# Patient Record
Sex: Female | Born: 1940
Health system: Southern US, Community
[De-identification: ages and names within clinical notes are randomized; demographics above are authoritative.]

## PROBLEM LIST (undated history)

## (undated) DIAGNOSIS — R519 Headache, unspecified: Secondary | ICD-10-CM

## (undated) DIAGNOSIS — I10 Essential (primary) hypertension: Secondary | ICD-10-CM

## (undated) DIAGNOSIS — M1990 Unspecified osteoarthritis, unspecified site: Secondary | ICD-10-CM

## (undated) DIAGNOSIS — R51 Headache: Secondary | ICD-10-CM

## (undated) HISTORY — PX: TONSILLECTOMY: SUR1361

## (undated) HISTORY — PX: TUBAL LIGATION: SHX77

## (undated) HISTORY — PX: SQUAMOUS CELL CARCINOMA EXCISION: SHX2433

---

## 1999-06-07 ENCOUNTER — Encounter: Admission: RE | Admit: 1999-06-07 | Discharge: 1999-06-07 | Payer: Self-pay | Admitting: Obstetrics and Gynecology

## 1999-06-07 ENCOUNTER — Encounter: Payer: Self-pay | Admitting: Obstetrics and Gynecology

## 2000-06-11 ENCOUNTER — Encounter: Admission: RE | Admit: 2000-06-11 | Discharge: 2000-06-11 | Payer: Self-pay | Admitting: Obstetrics and Gynecology

## 2000-06-11 ENCOUNTER — Encounter: Payer: Self-pay | Admitting: Obstetrics and Gynecology

## 2001-06-16 ENCOUNTER — Encounter: Payer: Self-pay | Admitting: Obstetrics and Gynecology

## 2001-06-16 ENCOUNTER — Encounter: Admission: RE | Admit: 2001-06-16 | Discharge: 2001-06-16 | Payer: Self-pay | Admitting: Obstetrics and Gynecology

## 2002-06-22 ENCOUNTER — Encounter: Payer: Self-pay | Admitting: Obstetrics and Gynecology

## 2002-06-22 ENCOUNTER — Encounter: Admission: RE | Admit: 2002-06-22 | Discharge: 2002-06-22 | Payer: Self-pay | Admitting: Obstetrics and Gynecology

## 2003-06-28 ENCOUNTER — Encounter: Admission: RE | Admit: 2003-06-28 | Discharge: 2003-06-28 | Payer: Self-pay | Admitting: Obstetrics and Gynecology

## 2004-07-05 ENCOUNTER — Ambulatory Visit (HOSPITAL_COMMUNITY): Admission: RE | Admit: 2004-07-05 | Discharge: 2004-07-05 | Payer: Self-pay | Admitting: Obstetrics and Gynecology

## 2010-06-10 ENCOUNTER — Encounter: Payer: Self-pay | Admitting: Obstetrics and Gynecology

## 2011-07-15 DIAGNOSIS — E785 Hyperlipidemia, unspecified: Secondary | ICD-10-CM | POA: Diagnosis not present

## 2011-07-15 DIAGNOSIS — I10 Essential (primary) hypertension: Secondary | ICD-10-CM | POA: Diagnosis not present

## 2011-07-15 DIAGNOSIS — IMO0002 Reserved for concepts with insufficient information to code with codable children: Secondary | ICD-10-CM | POA: Diagnosis not present

## 2011-10-17 DIAGNOSIS — I10 Essential (primary) hypertension: Secondary | ICD-10-CM | POA: Diagnosis not present

## 2011-10-17 DIAGNOSIS — E785 Hyperlipidemia, unspecified: Secondary | ICD-10-CM | POA: Diagnosis not present

## 2011-10-17 DIAGNOSIS — E538 Deficiency of other specified B group vitamins: Secondary | ICD-10-CM | POA: Diagnosis not present

## 2011-10-17 DIAGNOSIS — D649 Anemia, unspecified: Secondary | ICD-10-CM | POA: Diagnosis not present

## 2011-10-17 DIAGNOSIS — Z0000 Encounter for general adult medical examination without abnormal findings: Secondary | ICD-10-CM | POA: Diagnosis not present

## 2011-10-17 DIAGNOSIS — Z124 Encounter for screening for malignant neoplasm of cervix: Secondary | ICD-10-CM | POA: Diagnosis not present

## 2011-10-17 DIAGNOSIS — Z1231 Encounter for screening mammogram for malignant neoplasm of breast: Secondary | ICD-10-CM | POA: Diagnosis not present

## 2011-10-17 DIAGNOSIS — Z681 Body mass index (BMI) 19 or less, adult: Secondary | ICD-10-CM | POA: Diagnosis not present

## 2011-10-21 DIAGNOSIS — Z1231 Encounter for screening mammogram for malignant neoplasm of breast: Secondary | ICD-10-CM | POA: Diagnosis not present

## 2012-03-02 DIAGNOSIS — Z23 Encounter for immunization: Secondary | ICD-10-CM | POA: Diagnosis not present

## 2012-03-13 DIAGNOSIS — E785 Hyperlipidemia, unspecified: Secondary | ICD-10-CM | POA: Diagnosis not present

## 2012-03-13 DIAGNOSIS — M5137 Other intervertebral disc degeneration, lumbosacral region: Secondary | ICD-10-CM | POA: Diagnosis not present

## 2012-03-13 DIAGNOSIS — I10 Essential (primary) hypertension: Secondary | ICD-10-CM | POA: Diagnosis not present

## 2012-03-13 DIAGNOSIS — M899 Disorder of bone, unspecified: Secondary | ICD-10-CM | POA: Diagnosis not present

## 2012-04-02 DIAGNOSIS — E875 Hyperkalemia: Secondary | ICD-10-CM | POA: Diagnosis not present

## 2012-04-02 DIAGNOSIS — M519 Unspecified thoracic, thoracolumbar and lumbosacral intervertebral disc disorder: Secondary | ICD-10-CM | POA: Diagnosis not present

## 2012-04-02 DIAGNOSIS — IMO0002 Reserved for concepts with insufficient information to code with codable children: Secondary | ICD-10-CM | POA: Diagnosis not present

## 2012-04-02 DIAGNOSIS — M5430 Sciatica, unspecified side: Secondary | ICD-10-CM | POA: Diagnosis not present

## 2012-04-27 DIAGNOSIS — M545 Low back pain: Secondary | ICD-10-CM | POA: Diagnosis not present

## 2012-04-27 DIAGNOSIS — IMO0002 Reserved for concepts with insufficient information to code with codable children: Secondary | ICD-10-CM | POA: Diagnosis not present

## 2012-04-27 DIAGNOSIS — R42 Dizziness and giddiness: Secondary | ICD-10-CM | POA: Diagnosis not present

## 2012-04-29 DIAGNOSIS — M545 Low back pain: Secondary | ICD-10-CM | POA: Diagnosis not present

## 2012-05-01 DIAGNOSIS — M545 Low back pain: Secondary | ICD-10-CM | POA: Diagnosis not present

## 2012-05-04 DIAGNOSIS — M545 Low back pain: Secondary | ICD-10-CM | POA: Diagnosis not present

## 2012-05-06 DIAGNOSIS — M545 Low back pain: Secondary | ICD-10-CM | POA: Diagnosis not present

## 2012-05-08 DIAGNOSIS — M545 Low back pain: Secondary | ICD-10-CM | POA: Diagnosis not present

## 2012-05-18 DIAGNOSIS — M545 Low back pain: Secondary | ICD-10-CM | POA: Diagnosis not present

## 2012-05-22 DIAGNOSIS — M545 Low back pain: Secondary | ICD-10-CM | POA: Diagnosis not present

## 2012-05-25 DIAGNOSIS — M545 Low back pain: Secondary | ICD-10-CM | POA: Diagnosis not present

## 2012-05-27 DIAGNOSIS — M545 Low back pain: Secondary | ICD-10-CM | POA: Diagnosis not present

## 2012-05-29 DIAGNOSIS — M545 Low back pain: Secondary | ICD-10-CM | POA: Diagnosis not present

## 2012-06-01 DIAGNOSIS — M545 Low back pain: Secondary | ICD-10-CM | POA: Diagnosis not present

## 2012-06-03 DIAGNOSIS — M545 Low back pain: Secondary | ICD-10-CM | POA: Diagnosis not present

## 2012-06-05 DIAGNOSIS — M545 Low back pain: Secondary | ICD-10-CM | POA: Diagnosis not present

## 2012-06-12 DIAGNOSIS — M545 Low back pain: Secondary | ICD-10-CM | POA: Diagnosis not present

## 2012-07-15 DIAGNOSIS — I10 Essential (primary) hypertension: Secondary | ICD-10-CM | POA: Diagnosis not present

## 2012-07-15 DIAGNOSIS — E785 Hyperlipidemia, unspecified: Secondary | ICD-10-CM | POA: Diagnosis not present

## 2012-08-10 DIAGNOSIS — L570 Actinic keratosis: Secondary | ICD-10-CM | POA: Diagnosis not present

## 2012-08-10 DIAGNOSIS — K130 Diseases of lips: Secondary | ICD-10-CM | POA: Diagnosis not present

## 2012-09-10 DIAGNOSIS — L570 Actinic keratosis: Secondary | ICD-10-CM | POA: Diagnosis not present

## 2012-11-12 DIAGNOSIS — Z1331 Encounter for screening for depression: Secondary | ICD-10-CM | POA: Diagnosis not present

## 2012-11-12 DIAGNOSIS — Z9181 History of falling: Secondary | ICD-10-CM | POA: Diagnosis not present

## 2012-11-12 DIAGNOSIS — E785 Hyperlipidemia, unspecified: Secondary | ICD-10-CM | POA: Diagnosis not present

## 2012-11-12 DIAGNOSIS — R5381 Other malaise: Secondary | ICD-10-CM | POA: Diagnosis not present

## 2012-11-12 DIAGNOSIS — M5430 Sciatica, unspecified side: Secondary | ICD-10-CM | POA: Diagnosis not present

## 2012-11-12 DIAGNOSIS — I10 Essential (primary) hypertension: Secondary | ICD-10-CM | POA: Diagnosis not present

## 2012-11-30 DIAGNOSIS — M899 Disorder of bone, unspecified: Secondary | ICD-10-CM | POA: Diagnosis not present

## 2012-11-30 DIAGNOSIS — Z1231 Encounter for screening mammogram for malignant neoplasm of breast: Secondary | ICD-10-CM | POA: Diagnosis not present

## 2013-02-19 DIAGNOSIS — Z23 Encounter for immunization: Secondary | ICD-10-CM | POA: Diagnosis not present

## 2013-03-17 DIAGNOSIS — IMO0002 Reserved for concepts with insufficient information to code with codable children: Secondary | ICD-10-CM | POA: Diagnosis not present

## 2013-03-17 DIAGNOSIS — E871 Hypo-osmolality and hyponatremia: Secondary | ICD-10-CM | POA: Diagnosis not present

## 2013-03-17 DIAGNOSIS — E785 Hyperlipidemia, unspecified: Secondary | ICD-10-CM | POA: Diagnosis not present

## 2013-03-17 DIAGNOSIS — I10 Essential (primary) hypertension: Secondary | ICD-10-CM | POA: Diagnosis not present

## 2013-03-17 DIAGNOSIS — R233 Spontaneous ecchymoses: Secondary | ICD-10-CM | POA: Diagnosis not present

## 2013-05-25 DIAGNOSIS — IMO0002 Reserved for concepts with insufficient information to code with codable children: Secondary | ICD-10-CM | POA: Diagnosis not present

## 2013-05-25 DIAGNOSIS — R002 Palpitations: Secondary | ICD-10-CM | POA: Diagnosis not present

## 2013-05-27 DIAGNOSIS — E785 Hyperlipidemia, unspecified: Secondary | ICD-10-CM | POA: Diagnosis not present

## 2013-05-27 DIAGNOSIS — I10 Essential (primary) hypertension: Secondary | ICD-10-CM | POA: Diagnosis not present

## 2013-05-27 DIAGNOSIS — R55 Syncope and collapse: Secondary | ICD-10-CM | POA: Diagnosis not present

## 2013-06-07 DIAGNOSIS — R55 Syncope and collapse: Secondary | ICD-10-CM | POA: Diagnosis not present

## 2013-06-11 DIAGNOSIS — Z23 Encounter for immunization: Secondary | ICD-10-CM | POA: Diagnosis not present

## 2013-06-11 DIAGNOSIS — M5430 Sciatica, unspecified side: Secondary | ICD-10-CM | POA: Diagnosis not present

## 2013-06-11 DIAGNOSIS — Z0000 Encounter for general adult medical examination without abnormal findings: Secondary | ICD-10-CM | POA: Diagnosis not present

## 2013-06-11 DIAGNOSIS — IMO0002 Reserved for concepts with insufficient information to code with codable children: Secondary | ICD-10-CM | POA: Diagnosis not present

## 2013-06-21 DIAGNOSIS — R55 Syncope and collapse: Secondary | ICD-10-CM | POA: Diagnosis not present

## 2013-06-23 DIAGNOSIS — R55 Syncope and collapse: Secondary | ICD-10-CM | POA: Diagnosis not present

## 2013-06-24 DIAGNOSIS — R55 Syncope and collapse: Secondary | ICD-10-CM | POA: Diagnosis not present

## 2013-06-24 DIAGNOSIS — I10 Essential (primary) hypertension: Secondary | ICD-10-CM | POA: Diagnosis not present

## 2013-06-24 DIAGNOSIS — E785 Hyperlipidemia, unspecified: Secondary | ICD-10-CM | POA: Diagnosis not present

## 2013-07-09 DIAGNOSIS — R42 Dizziness and giddiness: Secondary | ICD-10-CM | POA: Diagnosis not present

## 2013-07-09 DIAGNOSIS — E785 Hyperlipidemia, unspecified: Secondary | ICD-10-CM | POA: Diagnosis not present

## 2013-07-09 DIAGNOSIS — IMO0002 Reserved for concepts with insufficient information to code with codable children: Secondary | ICD-10-CM | POA: Diagnosis not present

## 2013-07-09 DIAGNOSIS — I10 Essential (primary) hypertension: Secondary | ICD-10-CM | POA: Diagnosis not present

## 2013-07-20 DIAGNOSIS — D0430 Carcinoma in situ of skin of unspecified part of face: Secondary | ICD-10-CM | POA: Diagnosis not present

## 2013-07-20 DIAGNOSIS — D0439 Carcinoma in situ of skin of other parts of face: Secondary | ICD-10-CM | POA: Diagnosis not present

## 2013-07-26 DIAGNOSIS — E871 Hypo-osmolality and hyponatremia: Secondary | ICD-10-CM | POA: Diagnosis not present

## 2013-07-28 DIAGNOSIS — H26499 Other secondary cataract, unspecified eye: Secondary | ICD-10-CM | POA: Diagnosis not present

## 2013-07-28 DIAGNOSIS — Z961 Presence of intraocular lens: Secondary | ICD-10-CM | POA: Diagnosis not present

## 2013-07-28 DIAGNOSIS — H5200 Hypermetropia, unspecified eye: Secondary | ICD-10-CM | POA: Diagnosis not present

## 2013-10-04 DIAGNOSIS — I10 Essential (primary) hypertension: Secondary | ICD-10-CM | POA: Diagnosis not present

## 2013-10-04 DIAGNOSIS — E785 Hyperlipidemia, unspecified: Secondary | ICD-10-CM | POA: Diagnosis not present

## 2013-10-04 DIAGNOSIS — R55 Syncope and collapse: Secondary | ICD-10-CM | POA: Diagnosis not present

## 2013-10-14 DIAGNOSIS — Z961 Presence of intraocular lens: Secondary | ICD-10-CM | POA: Diagnosis not present

## 2013-10-14 DIAGNOSIS — M5430 Sciatica, unspecified side: Secondary | ICD-10-CM | POA: Diagnosis not present

## 2013-10-14 DIAGNOSIS — H26499 Other secondary cataract, unspecified eye: Secondary | ICD-10-CM | POA: Diagnosis not present

## 2013-10-14 DIAGNOSIS — I10 Essential (primary) hypertension: Secondary | ICD-10-CM | POA: Diagnosis not present

## 2013-10-14 DIAGNOSIS — E871 Hypo-osmolality and hyponatremia: Secondary | ICD-10-CM | POA: Diagnosis not present

## 2013-10-14 DIAGNOSIS — E785 Hyperlipidemia, unspecified: Secondary | ICD-10-CM | POA: Diagnosis not present

## 2013-10-25 DIAGNOSIS — M545 Low back pain, unspecified: Secondary | ICD-10-CM | POA: Diagnosis not present

## 2013-10-25 DIAGNOSIS — M533 Sacrococcygeal disorders, not elsewhere classified: Secondary | ICD-10-CM | POA: Diagnosis not present

## 2013-10-29 DIAGNOSIS — M545 Low back pain, unspecified: Secondary | ICD-10-CM | POA: Diagnosis not present

## 2013-10-29 DIAGNOSIS — M533 Sacrococcygeal disorders, not elsewhere classified: Secondary | ICD-10-CM | POA: Diagnosis not present

## 2013-11-01 DIAGNOSIS — M545 Low back pain, unspecified: Secondary | ICD-10-CM | POA: Diagnosis not present

## 2013-11-01 DIAGNOSIS — M533 Sacrococcygeal disorders, not elsewhere classified: Secondary | ICD-10-CM | POA: Diagnosis not present

## 2013-11-05 DIAGNOSIS — M533 Sacrococcygeal disorders, not elsewhere classified: Secondary | ICD-10-CM | POA: Diagnosis not present

## 2013-11-05 DIAGNOSIS — M545 Low back pain, unspecified: Secondary | ICD-10-CM | POA: Diagnosis not present

## 2013-11-08 DIAGNOSIS — M545 Low back pain, unspecified: Secondary | ICD-10-CM | POA: Diagnosis not present

## 2013-11-08 DIAGNOSIS — M533 Sacrococcygeal disorders, not elsewhere classified: Secondary | ICD-10-CM | POA: Diagnosis not present

## 2013-11-12 DIAGNOSIS — M545 Low back pain, unspecified: Secondary | ICD-10-CM | POA: Diagnosis not present

## 2013-11-12 DIAGNOSIS — M533 Sacrococcygeal disorders, not elsewhere classified: Secondary | ICD-10-CM | POA: Diagnosis not present

## 2013-11-15 DIAGNOSIS — M545 Low back pain, unspecified: Secondary | ICD-10-CM | POA: Diagnosis not present

## 2013-11-15 DIAGNOSIS — M533 Sacrococcygeal disorders, not elsewhere classified: Secondary | ICD-10-CM | POA: Diagnosis not present

## 2013-11-17 DIAGNOSIS — M545 Low back pain, unspecified: Secondary | ICD-10-CM | POA: Diagnosis not present

## 2013-11-17 DIAGNOSIS — M533 Sacrococcygeal disorders, not elsewhere classified: Secondary | ICD-10-CM | POA: Diagnosis not present

## 2013-11-22 DIAGNOSIS — M545 Low back pain, unspecified: Secondary | ICD-10-CM | POA: Diagnosis not present

## 2013-11-22 DIAGNOSIS — M533 Sacrococcygeal disorders, not elsewhere classified: Secondary | ICD-10-CM | POA: Diagnosis not present

## 2013-11-26 DIAGNOSIS — M545 Low back pain, unspecified: Secondary | ICD-10-CM | POA: Diagnosis not present

## 2013-11-26 DIAGNOSIS — M533 Sacrococcygeal disorders, not elsewhere classified: Secondary | ICD-10-CM | POA: Diagnosis not present

## 2013-12-01 DIAGNOSIS — M533 Sacrococcygeal disorders, not elsewhere classified: Secondary | ICD-10-CM | POA: Diagnosis not present

## 2013-12-01 DIAGNOSIS — M545 Low back pain, unspecified: Secondary | ICD-10-CM | POA: Diagnosis not present

## 2013-12-02 DIAGNOSIS — Z1231 Encounter for screening mammogram for malignant neoplasm of breast: Secondary | ICD-10-CM | POA: Diagnosis not present

## 2013-12-03 DIAGNOSIS — M545 Low back pain, unspecified: Secondary | ICD-10-CM | POA: Diagnosis not present

## 2013-12-03 DIAGNOSIS — M533 Sacrococcygeal disorders, not elsewhere classified: Secondary | ICD-10-CM | POA: Diagnosis not present

## 2013-12-08 DIAGNOSIS — M545 Low back pain, unspecified: Secondary | ICD-10-CM | POA: Diagnosis not present

## 2013-12-08 DIAGNOSIS — M533 Sacrococcygeal disorders, not elsewhere classified: Secondary | ICD-10-CM | POA: Diagnosis not present

## 2013-12-10 DIAGNOSIS — M533 Sacrococcygeal disorders, not elsewhere classified: Secondary | ICD-10-CM | POA: Diagnosis not present

## 2013-12-10 DIAGNOSIS — M545 Low back pain, unspecified: Secondary | ICD-10-CM | POA: Diagnosis not present

## 2013-12-13 DIAGNOSIS — M545 Low back pain, unspecified: Secondary | ICD-10-CM | POA: Diagnosis not present

## 2013-12-13 DIAGNOSIS — M533 Sacrococcygeal disorders, not elsewhere classified: Secondary | ICD-10-CM | POA: Diagnosis not present

## 2013-12-22 DIAGNOSIS — M545 Low back pain, unspecified: Secondary | ICD-10-CM | POA: Diagnosis not present

## 2013-12-22 DIAGNOSIS — M533 Sacrococcygeal disorders, not elsewhere classified: Secondary | ICD-10-CM | POA: Diagnosis not present

## 2013-12-24 DIAGNOSIS — M545 Low back pain, unspecified: Secondary | ICD-10-CM | POA: Diagnosis not present

## 2013-12-24 DIAGNOSIS — M533 Sacrococcygeal disorders, not elsewhere classified: Secondary | ICD-10-CM | POA: Diagnosis not present

## 2013-12-29 DIAGNOSIS — M533 Sacrococcygeal disorders, not elsewhere classified: Secondary | ICD-10-CM | POA: Diagnosis not present

## 2013-12-29 DIAGNOSIS — M545 Low back pain, unspecified: Secondary | ICD-10-CM | POA: Diagnosis not present

## 2013-12-31 DIAGNOSIS — M533 Sacrococcygeal disorders, not elsewhere classified: Secondary | ICD-10-CM | POA: Diagnosis not present

## 2013-12-31 DIAGNOSIS — M545 Low back pain, unspecified: Secondary | ICD-10-CM | POA: Diagnosis not present

## 2014-01-03 DIAGNOSIS — M545 Low back pain, unspecified: Secondary | ICD-10-CM | POA: Diagnosis not present

## 2014-01-03 DIAGNOSIS — M533 Sacrococcygeal disorders, not elsewhere classified: Secondary | ICD-10-CM | POA: Diagnosis not present

## 2014-01-12 DIAGNOSIS — M545 Low back pain, unspecified: Secondary | ICD-10-CM | POA: Diagnosis not present

## 2014-01-12 DIAGNOSIS — M533 Sacrococcygeal disorders, not elsewhere classified: Secondary | ICD-10-CM | POA: Diagnosis not present

## 2014-01-27 DIAGNOSIS — E785 Hyperlipidemia, unspecified: Secondary | ICD-10-CM | POA: Diagnosis not present

## 2014-01-27 DIAGNOSIS — M25559 Pain in unspecified hip: Secondary | ICD-10-CM | POA: Diagnosis not present

## 2014-01-27 DIAGNOSIS — I10 Essential (primary) hypertension: Secondary | ICD-10-CM | POA: Diagnosis not present

## 2014-01-27 DIAGNOSIS — I471 Supraventricular tachycardia: Secondary | ICD-10-CM | POA: Diagnosis not present

## 2014-02-24 DIAGNOSIS — Z23 Encounter for immunization: Secondary | ICD-10-CM | POA: Diagnosis not present

## 2014-02-25 DIAGNOSIS — M1611 Unilateral primary osteoarthritis, right hip: Secondary | ICD-10-CM | POA: Diagnosis not present

## 2014-03-09 DIAGNOSIS — I10 Essential (primary) hypertension: Secondary | ICD-10-CM | POA: Diagnosis not present

## 2014-03-09 DIAGNOSIS — M519 Unspecified thoracic, thoracolumbar and lumbosacral intervertebral disc disorder: Secondary | ICD-10-CM | POA: Diagnosis not present

## 2014-03-09 DIAGNOSIS — M1611 Unilateral primary osteoarthritis, right hip: Secondary | ICD-10-CM | POA: Diagnosis not present

## 2014-03-09 DIAGNOSIS — K219 Gastro-esophageal reflux disease without esophagitis: Secondary | ICD-10-CM | POA: Diagnosis not present

## 2014-03-09 DIAGNOSIS — I471 Supraventricular tachycardia: Secondary | ICD-10-CM | POA: Diagnosis not present

## 2014-03-09 DIAGNOSIS — E785 Hyperlipidemia, unspecified: Secondary | ICD-10-CM | POA: Diagnosis not present

## 2014-03-09 DIAGNOSIS — Z6822 Body mass index (BMI) 22.0-22.9, adult: Secondary | ICD-10-CM | POA: Diagnosis not present

## 2014-03-16 DIAGNOSIS — M1611 Unilateral primary osteoarthritis, right hip: Secondary | ICD-10-CM | POA: Diagnosis not present

## 2014-03-16 DIAGNOSIS — I10 Essential (primary) hypertension: Secondary | ICD-10-CM | POA: Diagnosis not present

## 2014-03-16 DIAGNOSIS — R55 Syncope and collapse: Secondary | ICD-10-CM | POA: Diagnosis not present

## 2014-03-16 DIAGNOSIS — Z01810 Encounter for preprocedural cardiovascular examination: Secondary | ICD-10-CM | POA: Diagnosis not present

## 2014-03-16 DIAGNOSIS — E785 Hyperlipidemia, unspecified: Secondary | ICD-10-CM | POA: Diagnosis not present

## 2014-03-16 DIAGNOSIS — R002 Palpitations: Secondary | ICD-10-CM | POA: Diagnosis not present

## 2014-05-26 NOTE — Progress Notes (Signed)
Please put orders in Epic surgery 06-08-14 pre op 06-01-14 Thanks

## 2014-05-28 ENCOUNTER — Ambulatory Visit: Payer: Self-pay | Admitting: Orthopedic Surgery

## 2014-05-28 NOTE — Progress Notes (Signed)
Preoperative surgical orders have been place into the Epic hospital system for Veronica Maynard on 05/28/2014, 9:16 AM  by Mickel Crow for surgery on 06/08/2014.  Preop Total Hip - Anterior Approach orders including Experel Injecion, IV Tylenol, and IV Decadron as long as there are no contraindications to the above medications. Veronica Muslim, PA-C

## 2014-05-31 ENCOUNTER — Other Ambulatory Visit (HOSPITAL_COMMUNITY): Payer: Self-pay | Admitting: *Deleted

## 2014-05-31 NOTE — Patient Instructions (Addendum)
Veronica Maynard  05/31/2014   Your procedure is scheduled on: Wednesday 06/08/2014  Report to [facility] Main  Entrance and follow signs to               [place] at [place] AM.  Call this number if you have problems the morning of surgery [phone]   Remember:  Do not eat food or drink liquids :After Midnight.     Take these medicines the morning of surgery with A SIP OF WATER:NONE                               You may not have any metal on your body including hair pins and              piercings  Do not wear jewelry, make-up, lotions, powders or perfumes.             Do not wear nail polish.  Do not shave  48 hours prior to surgery.              Men may shave face and neck.   Do not bring valuables to the hospital. [place].  Contacts, dentures or bridgework may not be worn into surgery.  Leave suitcase in the car. After surgery it may be brought to your room.     Patients discharged the day of surgery will not be allowed to drive home.  Name and phone number of your driver:  Special Instructions: N/A              Please read over the following fact sheets you were given: _____________________________________________________________________             [facility] - Preparing for Surgery Before surgery, you can play an important role.  Because skin is not sterile, your skin needs to be as free of germs as possible.  You can reduce the number of germs on your skin by washing with CHG (chlorahexidine gluconate) soap before surgery.  CHG is an antiseptic cleaner which kills germs and bonds with the skin to continue killing germs even after washing. Please DO NOT use if you have an allergy to CHG or antibacterial soaps.  If your skin becomes reddened/irritated stop using the CHG and inform your nurse when you arrive at Short Stay. Do not shave (including legs and underarms) for at least 48 hours  prior to the first CHG shower.  You may shave your face/neck. Please follow these instructions carefully:  1.  Shower with CHG Soap the night before surgery and the  morning of Surgery.  2.  If you choose to wash your hair, wash your hair first as usual with your  normal  shampoo.  3.  After you shampoo, rinse your hair and body thoroughly to remove the  shampoo.                           4.  Use CHG as you would any other liquid soap.  You can apply chg directly  to the skin and wash                       Gently with  a scrungie or clean washcloth.  5.  Apply the CHG Soap to your body ONLY FROM THE NECK DOWN.   Do not use on face/ open                           Wound or open sores. Avoid contact with eyes, ears mouth and genitals (private parts).                       Wash face,  Genitals (private parts) with your normal soap.             6.  Wash thoroughly, paying special attention to the area where your surgery  will be performed.  7.  Thoroughly rinse your body with warm water from the neck down.  8.  DO NOT shower/wash with your normal soap after using and rinsing off  the CHG Soap.                9.  Pat yourself dry with a clean towel.            10.  Wear clean pajamas.            11.  Place clean sheets on your bed the night of your first shower and do not  sleep with pets. Day of Surgery : Do not apply any lotions/deodorants the morning of surgery.  Please wear clean clothes to the hospital/surgery center.  FAILURE TO FOLLOW THESE INSTRUCTIONS MAY RESULT IN THE CANCELLATION OF YOUR SURGERY PATIENT SIGNATURE_________________________________  NURSE SIGNATURE__________________________________  ________________________________________________________________________   Adam Phenix  An incentive spirometer is a tool that can help keep your lungs clear and active. This tool measures how well you are filling your lungs with each breath. Taking long deep breaths may help reverse  or decrease the chance of developing breathing (pulmonary) problems (especially infection) following:  A long period of time when you are unable to move or be active. BEFORE THE PROCEDURE   If the spirometer includes an indicator to show your best effort, your nurse or respiratory therapist will set it to a desired goal.  If possible, sit up straight or lean slightly forward. Try not to slouch.  Hold the incentive spirometer in an upright position. INSTRUCTIONS FOR USE   Sit on the edge of your bed if possible, or sit up as far as you can in bed or on a chair.  Hold the incentive spirometer in an upright position.  Breathe out normally.  Place the mouthpiece in your mouth and seal your lips tightly around it.  Breathe in slowly and as deeply as possible, raising the piston or the ball toward the top of the column.  Hold your breath for 3-5 seconds or for as long as possible. Allow the piston or ball to fall to the bottom of the column.  Remove the mouthpiece from your mouth and breathe out normally.  Rest for a few seconds and repeat Steps 1 through 7 at least 10 times every 1-2 hours when you are awake. Take your time and take a few normal breaths between deep breaths.  The spirometer may include an indicator to show your best effort. Use the indicator as a goal to work toward during each repetition.  After each set of 10 deep breaths, practice coughing to be sure your lungs are clear. If you have an incision (the cut made at the time of surgery), support your  incision when coughing by placing a pillow or rolled up towels firmly against it. Once you are able to get out of bed, walk around indoors and cough well. You may stop using the incentive spirometer when instructed by your caregiver.  RISKS AND COMPLICATIONS  Take your time so you do not get dizzy or light-headed.  If you are in pain, you may need to take or ask for pain medication before doing incentive spirometry. It is  harder to take a deep breath if you are having pain. AFTER USE  Rest and breathe slowly and easily.  It can be helpful to keep track of a log of your progress. Your caregiver can provide you with a simple table to help with this. If you are using the spirometer at home, follow these instructions: [place] IF:   You are having difficultly using the spirometer.  You have trouble using the spirometer as often as instructed.  Your pain medication is not giving enough relief while using the spirometer.  You develop fever of 100.5 F (38.1 C) or higher. SEEK IMMEDIATE MEDICAL CARE IF:   You cough up bloody sputum that had not been present before.  You develop fever of 102 F (38.9 C) or greater.  You develop worsening pain at or near the incision site. MAKE SURE YOU:   Understand these instructions.  Will watch your condition.  Will get help right away if you are not doing well or get worse. Document Released: 09/16/2006 Document Revised: 07/29/2011 Document Reviewed: 11/17/2006 Exit[employer] Patient Information 2014 Exit[employer], [place].   ________________________________________________________________________  WHAT IS A BLOOD TRANSFUSION? Blood Transfusion Information  A transfusion is the replacement of blood or some of its parts. Blood is made up of multiple cells which provide different functions.  Red blood cells carry oxygen and are used for blood loss replacement.  White blood cells fight against infection.  Platelets control bleeding.  Plasma helps clot blood.  Other blood products are available for specialized needs, such as hemophilia or other clotting disorders. BEFORE THE TRANSFUSION  Who gives blood for transfusions?   Healthy volunteers who are fully evaluated to make sure their blood is safe. This is blood bank blood. Transfusion therapy is the safest it has ever been in the practice of medicine. Before blood is taken from a donor, a complete history  is taken to make sure that person has no history of diseases nor engages in risky social behavior (examples are intravenous drug use or sexual activity with multiple partners). The donor's travel history is screened to minimize risk of transmitting infections, such as malaria. The donated blood is tested for signs of infectious diseases, such as HIV and hepatitis. The blood is then tested to be sure it is compatible with you in order to minimize the chance of a transfusion reaction. If you or a relative donates blood, this is often done in anticipation of surgery and is not appropriate for emergency situations. It takes many days to process the donated blood. RISKS AND COMPLICATIONS Although transfusion therapy is very safe and saves many lives, the main dangers of transfusion include:   Getting an infectious disease.  Developing a transfusion reaction. This is an allergic reaction to something in the blood you were given. Every precaution is taken to prevent this. The decision to have a blood transfusion has been considered carefully by your caregiver before blood is given. Blood is not given unless the benefits outweigh the risks. AFTER THE TRANSFUSION  Right  after receiving a blood transfusion, you will usually feel much better and more energetic. This is especially true if your red blood cells have gotten low (anemic). The transfusion raises the level of the red blood cells which carry oxygen, and this usually causes an energy increase.  The nurse administering the transfusion will monitor you carefully for complications. HOME CARE INSTRUCTIONS  No special instructions are needed after a transfusion. You may find your energy is better. Speak with your caregiver about any limitations on activity for underlying diseases you may have. SEEK MEDICAL CARE IF:   Your condition is not improving after your transfusion.  You develop redness or irritation at the intravenous (IV) site. SEEK IMMEDIATE  MEDICAL CARE IF:  Any of the following symptoms occur over the next 12 hours:  Shaking chills.  You have a temperature by mouth above 102 F (38.9 C), not controlled by medicine.  Chest, back, or muscle pain.  People around you feel you are not acting correctly or are confused.  Shortness of breath or difficulty breathing.  Dizziness and fainting.  You get a rash or develop hives.  You have a decrease in urine output.  Your urine turns a dark color or changes to pink, red, or brown. Any of the following symptoms occur over the next 10 days:  You have a temperature by mouth above 102 F (38.9 C), not controlled by medicine.  Shortness of breath.  Weakness after normal activity.  The white part of the eye turns yellow (jaundice).  You have a decrease in the amount of urine or are urinating less often.  Your urine turns a dark color or changes to pink, red, or brown. Document Released: 05/03/2000 Document Revised: 07/29/2011 Document Reviewed: 12/21/2007 [facility] Patient Information 2014 [place], [place].  _______________________________________________________________________

## 2014-06-01 ENCOUNTER — Ambulatory Visit (HOSPITAL_COMMUNITY)
Admission: RE | Admit: 2014-06-01 | Discharge: 2014-06-01 | Disposition: A | Discharge status: Home / Self Care | Payer: Medicare Other | Source: Ambulatory Visit | Attending: Anesthesiology | Admitting: Anesthesiology

## 2014-06-01 ENCOUNTER — Encounter (HOSPITAL_COMMUNITY): Payer: Self-pay

## 2014-06-01 ENCOUNTER — Encounter (HOSPITAL_COMMUNITY)
Admission: RE | Admit: 2014-06-01 | Discharge: 2014-06-01 | Disposition: A | Discharge status: Home / Self Care | Payer: Medicare Other | Source: Ambulatory Visit | Attending: Orthopedic Surgery | Admitting: Orthopedic Surgery

## 2014-06-01 DIAGNOSIS — Z01812 Encounter for preprocedural laboratory examination: Secondary | ICD-10-CM | POA: Diagnosis not present

## 2014-06-01 DIAGNOSIS — I4891 Unspecified atrial fibrillation: Secondary | ICD-10-CM | POA: Diagnosis not present

## 2014-06-01 DIAGNOSIS — Z01818 Encounter for other preprocedural examination: Secondary | ICD-10-CM | POA: Insufficient documentation

## 2014-06-01 DIAGNOSIS — I2510 Atherosclerotic heart disease of native coronary artery without angina pectoris: Secondary | ICD-10-CM | POA: Insufficient documentation

## 2014-06-01 DIAGNOSIS — Z01810 Encounter for preprocedural cardiovascular examination: Secondary | ICD-10-CM | POA: Diagnosis not present

## 2014-06-01 DIAGNOSIS — I10 Essential (primary) hypertension: Secondary | ICD-10-CM

## 2014-06-01 DIAGNOSIS — Z7982 Long term (current) use of aspirin: Secondary | ICD-10-CM | POA: Diagnosis not present

## 2014-06-01 HISTORY — DX: Headache, unspecified: R51.9

## 2014-06-01 HISTORY — DX: Unspecified osteoarthritis, unspecified site: M19.90

## 2014-06-01 HISTORY — DX: Essential (primary) hypertension: I10

## 2014-06-01 HISTORY — DX: Headache: R51

## 2014-06-01 LAB — CBC
HEMATOCRIT: 37.2 % (ref 36.0–46.0)
Hemoglobin: 12.3 g/dL (ref 12.0–15.0)
MCH: 30 pg (ref 26.0–34.0)
MCHC: 33.1 g/dL (ref 30.0–36.0)
MCV: 90.7 fL (ref 78.0–100.0)
PLATELETS: 309 10*3/uL (ref 150–400)
RBC: 4.1 MIL/uL (ref 3.87–5.11)
RDW: 13.5 % (ref 11.5–15.5)
WBC: 6.4 10*3/uL (ref 4.0–10.5)

## 2014-06-01 LAB — COMPREHENSIVE METABOLIC PANEL
ALBUMIN: 4.4 g/dL (ref 3.5–5.2)
ALT: 19 U/L (ref 0–35)
ANION GAP: 6 (ref 5–15)
AST: 22 U/L (ref 0–37)
Alkaline Phosphatase: 55 U/L (ref 39–117)
BUN: 23 mg/dL (ref 6–23)
CHLORIDE: 93 meq/L — AB (ref 96–112)
CO2: 28 mmol/L (ref 19–32)
CREATININE: 0.92 mg/dL (ref 0.50–1.10)
Calcium: 9.2 mg/dL (ref 8.4–10.5)
GFR calc Af Amer: 69 mL/min — ABNORMAL LOW (ref >90)
GFR calc non Af Amer: 60 mL/min — ABNORMAL LOW (ref >90)
Glucose, Bld: 95 mg/dL (ref 70–99)
POTASSIUM: 5.6 mmol/L — AB (ref 3.5–5.1)
Sodium: 127 mmol/L — ABNORMAL LOW (ref 135–145)
TOTAL PROTEIN: 7.6 g/dL (ref 6.0–8.3)
Total Bilirubin: 0.4 mg/dL (ref 0.3–1.2)

## 2014-06-01 LAB — APTT: aPTT: 31 seconds (ref 24–37)

## 2014-06-01 LAB — URINALYSIS, ROUTINE W REFLEX MICROSCOPIC
Bilirubin Urine: NEGATIVE
Glucose, UA: NEGATIVE mg/dL
Hgb urine dipstick: NEGATIVE
KETONES UR: NEGATIVE mg/dL
Nitrite: NEGATIVE
PH: 6.5 (ref 5.0–8.0)
Protein, ur: NEGATIVE mg/dL
Specific Gravity, Urine: 1.02 (ref 1.005–1.030)
Urobilinogen, UA: 0.2 mg/dL (ref 0.0–1.0)

## 2014-06-01 LAB — URINE MICROSCOPIC-ADD ON

## 2014-06-01 LAB — PROTIME-INR
INR: 0.96 (ref 0.00–1.49)
Prothrombin Time: 12.9 seconds (ref 11.6–15.2)

## 2014-06-01 LAB — ABO/RH: ABO/RH(D): A POS

## 2014-06-01 LAB — SURGICAL PCR SCREEN
MRSA, PCR: NEGATIVE
STAPHYLOCOCCUS AUREUS: NEGATIVE

## 2014-06-01 NOTE — Progress Notes (Signed)
06/07/2013-Transthoracic Echocardiogram by Dr. Agustin Cree on chart. 02/28/2014-pre-operative clearance from Laverna Peace, NP on chart.

## 2014-06-03 NOTE — H&P (Signed)
TOTAL HIP ADMISSION H&P  Patient is admitted for right total hip arthroplasty.  Subjective:  Chief Complaint: right hip pain  HPI: Veronica Maynard, 74 y.o. female, has a history of pain and functional disability in the right hip(s) due to arthritis and patient has failed non-surgical conservative treatments for greater than 12 weeks to include NSAID's and/or analgesics, corticosteriod injections and activity modification.  Onset of symptoms was gradual starting 8 years ago with gradually worsening course since that time.The patient noted no past surgery on the right hip(s).  Patient currently rates pain in the right hip at 8 out of 10 with activity. Patient has night pain, worsening of pain with activity and weight bearing, pain that interfers with activities of daily living, pain with passive range of motion and crepitus. Patient has evidence of subchondral cysts, periarticular osteophytes and joint space narrowing by imaging studies. This condition presents safety issues increasing the risk of falls.  There is no current active infection.   Past Medical History  Diagnosis Date  . Hypertension   . Arthritis   . Headache     due to hormone replacement   Atrial fibrillation- no episodes since Jan 2015    Coronary artery disease  Basal cell carcinoma  Past Surgical History  Procedure Laterality Date  . Tonsillectomy      age 64  . Tubal ligation       Current outpatient prescriptions:  .  acetaminophen (TYLENOL) 500 MG tablet, Take 1,000 mg by mouth every 6 (six) hours as needed for mild pain or moderate pain., Disp: , Rfl:  .  alendronate (FOSAMAX) 70 MG tablet, Take 70 mg by mouth once a week. Take with a full glass of water on an empty stomach., Disp: , Rfl:  .  ascorbic acid (VITAMIN C) 500 MG tablet, Take 500 mg by mouth daily. Sunday-Tuesday-Thursday-Saturday, Disp: , Rfl:  .  aspirin 81 MG tablet, Take 81 mg by mouth daily after breakfast., Disp: , Rfl:  .  calcium  citrate-vitamin D (CITRACAL+D) 315-200 MG-UNIT per tablet, Take 1 tablet by mouth daily., Disp: , Rfl:  .  cholecalciferol (VITAMIN D) 1000 UNITS tablet, Take 1,000 Units by mouth daily. Vitamin D-3, Disp: , Rfl:  .  fluticasone (FLONASE) 50 MCG/ACT nasal spray, Place 2 sprays into both nostrils every evening., Disp: , Rfl:  .  folic acid (FOLVITE) 161 MCG tablet, Take 400 mcg by mouth daily. Monday-Wednesday-Friday, Disp: , Rfl:  .  HYDROcodone-acetaminophen (NORCO/VICODIN) 5-325 MG per tablet, Take 1 tablet by mouth every 6 (six) hours as needed for moderate pain., Disp: , Rfl:  .  loratadine (CLARITIN) 10 MG tablet, Take 10 mg by mouth daily., Disp: , Rfl:  .  metoprolol succinate (TOPROL-XL) 50 MG 24 hr tablet, Take 50 mg by mouth every evening. Take with or immediately following a meal., Disp: , Rfl:  .  olmesartan (BENICAR) 40 MG tablet, Take 40 mg by mouth every morning., Disp: , Rfl:  .  omega-3 fish oil (MAXEPA) 1000 MG CAPS capsule, Take 1 capsule by mouth daily. 1000/300 fish oil/omega 3, Disp: , Rfl:  .  pravastatin (PRAVACHOL) 20 MG tablet, Take 20 mg by mouth daily., Disp: , Rfl:  .  vitamin B-12 (CYANOCOBALAMIN) 1000 MCG tablet, Take 1,000 mcg by mouth daily. Monday-Wednesday-Friday, Disp: , Rfl:  .  Wheat Dextrin (BENEFIBER PO), Take 1 packet by mouth 2 (two) times daily., Disp: , Rfl:   No Known Allergies  History  Substance Use Topics  .  Smoking status: Never Smoker   . Smokeless tobacco: No  . Alcohol Use: Yes     Comment: rarely      Review of Systems  Constitutional: Negative.   HENT: Positive for hearing loss. Negative for congestion, ear discharge, ear pain, nosebleeds, sore throat and tinnitus.   Eyes: Negative.   Respiratory: Positive for shortness of breath. Negative for cough, hemoptysis, sputum production, wheezing and stridor.        SOB on exertion  Cardiovascular: Negative.   Gastrointestinal: Negative.   Genitourinary: Negative.   Musculoskeletal:  Positive for back pain and joint pain. Negative for myalgias, falls and neck pain.  Skin: Negative.   Neurological: Negative.  Negative for headaches.  Endo/Heme/Allergies: Negative.   Psychiatric/Behavioral: Negative.     Objective:  Physical Exam  Constitutional: She is oriented to person, place, and time. She appears well-developed and well-nourished. No distress.  HENT:  Head: Normocephalic and atraumatic.  Right Ear: External ear normal.  Left Ear: External ear normal.  Nose: Nose normal.  Mouth/Throat: Oropharynx is clear and moist.  Eyes: Conjunctivae and EOM are normal.  Neck: Normal range of motion. Neck supple.  Cardiovascular: Normal rate, regular rhythm, normal heart sounds and intact distal pulses.   No murmur heard. Respiratory: Effort normal and breath sounds normal. No respiratory distress. She has no wheezes.  GI: Soft. Bowel sounds are normal. She exhibits no distension. There is no tenderness.  Musculoskeletal:       Right hip: She exhibits decreased range of motion and decreased strength.       Left hip: Normal.       Right knee: Normal.       Left knee: Normal.       Right lower leg: She exhibits no tenderness and no swelling.       Left lower leg: She exhibits no tenderness and no swelling.  Evaluation of the left hip, normal range of motion. No discomfort. Her right hip flexion is 100, minimal internal rotation, about 10 degrees external rotation, 10-20 degrees of abduction. The knee exam is unremarkable.  Neurological: She is alert and oriented to person, place, and time. She has normal strength and normal reflexes. No sensory deficit.  Skin: No rash noted. She is not diaphoretic. No erythema.  Psychiatric: She has a normal mood and affect. Her behavior is normal.    Vitals  Weight: 140 lb Height: 67.25in Body Surface Area: 1.74 m Body Mass Index: 21.76 kg/m  Pulse: 58 (Regular)  BP: 161/65 (Sitting, Left Arm, Standard)  Imaging  Review Plain radiographs demonstrate severe degenerative joint disease of the right hip(s). The bone quality appears to be good for age and reported activity level.  Assessment/Plan:  End stage arthritis, right hip(s)  The patient history, physical examination, clinical judgement of the provider and imaging studies are consistent with end stage degenerative joint disease of the right hip(s) and total hip arthroplasty is deemed medically necessary. The treatment options including medical management, injection therapy, arthroscopy and arthroplasty were discussed at length. The risks and benefits of total hip arthroplasty were presented and reviewed. The risks due to aseptic loosening, infection, stiffness, dislocation/subluxation,  thromboembolic complications and other imponderables were discussed.  The patient acknowledged the explanation, agreed to proceed with the plan and consent was signed. Patient is being admitted for inpatient treatment for surgery, pain control, PT, OT, prophylactic antibiotics, VTE prophylaxis, progressive ambulation and ADL's and discharge planning.The patient is planning to be discharged home with home health  services    Topical TXA PCP: Ilda Basset, Amy Moon Cardio: [address] [place], [place]

## 2014-06-08 ENCOUNTER — Inpatient Hospital Stay (HOSPITAL_COMMUNITY): Payer: Medicare Other | Admitting: Anesthesiology

## 2014-06-08 ENCOUNTER — Encounter (HOSPITAL_COMMUNITY)
Admission: RE | Disposition: A | Discharge status: Home Health | Payer: Self-pay | Source: Ambulatory Visit | Attending: Orthopedic Surgery

## 2014-06-08 ENCOUNTER — Inpatient Hospital Stay (HOSPITAL_COMMUNITY): Payer: Medicare Other

## 2014-06-08 ENCOUNTER — Encounter (HOSPITAL_COMMUNITY): Payer: Self-pay

## 2014-06-08 ENCOUNTER — Inpatient Hospital Stay (HOSPITAL_COMMUNITY)
Admission: RE | Admit: 2014-06-08 | Discharge: 2014-06-12 | DRG: 470 | Disposition: A | Discharge status: Home Health | Payer: Medicare Other | Source: Ambulatory Visit | Attending: Orthopedic Surgery | Admitting: Orthopedic Surgery

## 2014-06-08 DIAGNOSIS — M62838 Other muscle spasm: Secondary | ICD-10-CM | POA: Diagnosis not present

## 2014-06-08 DIAGNOSIS — Z85828 Personal history of other malignant neoplasm of skin: Secondary | ICD-10-CM

## 2014-06-08 DIAGNOSIS — D62 Acute posthemorrhagic anemia: Secondary | ICD-10-CM | POA: Diagnosis not present

## 2014-06-08 DIAGNOSIS — I10 Essential (primary) hypertension: Secondary | ICD-10-CM | POA: Diagnosis present

## 2014-06-08 DIAGNOSIS — Z96641 Presence of right artificial hip joint: Secondary | ICD-10-CM | POA: Diagnosis not present

## 2014-06-08 DIAGNOSIS — M25551 Pain in right hip: Secondary | ICD-10-CM | POA: Diagnosis not present

## 2014-06-08 DIAGNOSIS — M169 Osteoarthritis of hip, unspecified: Secondary | ICD-10-CM | POA: Diagnosis present

## 2014-06-08 DIAGNOSIS — M1611 Unilateral primary osteoarthritis, right hip: Secondary | ICD-10-CM | POA: Diagnosis not present

## 2014-06-08 DIAGNOSIS — Z471 Aftercare following joint replacement surgery: Secondary | ICD-10-CM | POA: Diagnosis not present

## 2014-06-08 DIAGNOSIS — Z96649 Presence of unspecified artificial hip joint: Secondary | ICD-10-CM

## 2014-06-08 HISTORY — DX: Osteoarthritis of hip, unspecified: M16.9

## 2014-06-08 HISTORY — PX: TOTAL HIP ARTHROPLASTY: SHX124

## 2014-06-08 SURGERY — ARTHROPLASTY, HIP, TOTAL, ANTERIOR APPROACH
Anesthesia: Spinal | Site: Hip | Laterality: Right

## 2014-06-08 MED ORDER — BUPIVACAINE HCL (PF) 0.5 % IJ SOLN
INTRAMUSCULAR | Status: AC
  Filled 2014-06-08: qty 30

## 2014-06-08 MED ORDER — ONDANSETRON HCL 4 MG PO TABS: 4.0000 mg | ORAL_TABLET | Freq: Four times a day (QID) | ORAL | Status: DC | PRN

## 2014-06-08 MED ORDER — 0.9 % SODIUM CHLORIDE (POUR BTL) OPTIME
TOPICAL | Status: DC | PRN
  Administered 2014-06-08: 1000 mL

## 2014-06-08 MED ORDER — MORPHINE SULFATE 2 MG/ML IJ SOLN: 1.0000 mg | INTRAMUSCULAR | Status: DC | PRN

## 2014-06-08 MED ORDER — ACETAMINOPHEN 10 MG/ML IV SOLN
1000.0000 mg | Freq: Once | INTRAVENOUS | Status: AC
  Administered 2014-06-08: 1000 mg via INTRAVENOUS
  Filled 2014-06-08: qty 100

## 2014-06-08 MED ORDER — METOCLOPRAMIDE HCL 10 MG PO TABS: 5.0000 mg | ORAL_TABLET | Freq: Three times a day (TID) | ORAL | Status: DC | PRN

## 2014-06-08 MED ORDER — ACETAMINOPHEN 650 MG RE SUPP: 650.0000 mg | Freq: Four times a day (QID) | RECTAL | Status: DC | PRN

## 2014-06-08 MED ORDER — POLYVINYL ALCOHOL 1.4 % OP SOLN
1.0000 [drp] | OPHTHALMIC | Status: DC | PRN
  Administered 2014-06-08: 1 [drp] via OPHTHALMIC
  Filled 2014-06-08: qty 15

## 2014-06-08 MED ORDER — POLYETHYLENE GLYCOL 3350 17 G PO PACK
17.0000 g | PACK | Freq: Every day | ORAL | Status: DC | PRN
  Administered 2014-06-12: 17 g via ORAL
  Filled 2014-06-08: qty 1

## 2014-06-08 MED ORDER — METHOCARBAMOL 1000 MG/10ML IJ SOLN
500.0000 mg | Freq: Four times a day (QID) | INTRAVENOUS | Status: DC | PRN
  Filled 2014-06-08: qty 5

## 2014-06-08 MED ORDER — BUPIVACAINE HCL (PF) 0.25 % IJ SOLN
INTRAMUSCULAR | Status: DC | PRN
  Administered 2014-06-08: 30 mL

## 2014-06-08 MED ORDER — PHENYLEPHRINE HCL 10 MG/ML IJ SOLN
INTRAMUSCULAR | Status: AC
  Filled 2014-06-08: qty 1

## 2014-06-08 MED ORDER — BUPIVACAINE LIPOSOME 1.3 % IJ SUSP
INTRAMUSCULAR | Status: DC | PRN
  Administered 2014-06-08: 20 mL

## 2014-06-08 MED ORDER — ACETAMINOPHEN 500 MG PO TABS
1000.0000 mg | ORAL_TABLET | Freq: Four times a day (QID) | ORAL | Status: DC
  Administered 2014-06-08 – 2014-06-09 (×3): 1000 mg via ORAL
  Filled 2014-06-08 (×4): qty 2

## 2014-06-08 MED ORDER — SODIUM CHLORIDE 0.9 % IJ SOLN
INTRAMUSCULAR | Status: AC
  Filled 2014-06-08: qty 50

## 2014-06-08 MED ORDER — KETOROLAC TROMETHAMINE 15 MG/ML IJ SOLN: 7.5000 mg | Freq: Four times a day (QID) | INTRAMUSCULAR | Status: AC | PRN

## 2014-06-08 MED ORDER — DEXAMETHASONE SODIUM PHOSPHATE 10 MG/ML IJ SOLN
INTRAMUSCULAR | Status: AC
  Filled 2014-06-08: qty 1

## 2014-06-08 MED ORDER — HYDROMORPHONE HCL 1 MG/ML IJ SOLN: 0.2500 mg | INTRAMUSCULAR | Status: DC | PRN

## 2014-06-08 MED ORDER — MIDAZOLAM HCL 5 MG/5ML IJ SOLN
INTRAMUSCULAR | Status: DC | PRN
  Administered 2014-06-08: 2 mg via INTRAVENOUS

## 2014-06-08 MED ORDER — SODIUM CHLORIDE 0.9 % IV SOLN
INTRAVENOUS | Status: DC
  Administered 2014-06-08 – 2014-06-09 (×2): via INTRAVENOUS

## 2014-06-08 MED ORDER — MENTHOL 3 MG MT LOZG: 1.0000 | LOZENGE | OROMUCOSAL | Status: DC | PRN

## 2014-06-08 MED ORDER — STERILE WATER FOR IRRIGATION IR SOLN
Status: DC | PRN
  Administered 2014-06-08: 1500 mL

## 2014-06-08 MED ORDER — SODIUM CHLORIDE 0.9 % IV SOLN
INTRAVENOUS | Status: DC
Start: 2014-06-08 — End: 2014-06-08

## 2014-06-08 MED ORDER — EPHEDRINE SULFATE 50 MG/ML IJ SOLN
INTRAMUSCULAR | Status: DC | PRN
  Administered 2014-06-08 (×2): 5 mg via INTRAVENOUS

## 2014-06-08 MED ORDER — CEFAZOLIN SODIUM-DEXTROSE 2-3 GM-% IV SOLR
INTRAVENOUS | Status: AC
  Filled 2014-06-08: qty 50

## 2014-06-08 MED ORDER — SODIUM CHLORIDE 0.9 % IV SOLN
2000.0000 mg | Freq: Once | INTRAVENOUS | Status: DC
  Filled 2014-06-08: qty 20

## 2014-06-08 MED ORDER — ONDANSETRON HCL 4 MG/2ML IJ SOLN: 4.0000 mg | Freq: Four times a day (QID) | INTRAMUSCULAR | Status: DC | PRN

## 2014-06-08 MED ORDER — DEXAMETHASONE SODIUM PHOSPHATE 10 MG/ML IJ SOLN
10.0000 mg | Freq: Once | INTRAMUSCULAR | Status: AC
  Administered 2014-06-08: 10 mg via INTRAVENOUS

## 2014-06-08 MED ORDER — PROPOFOL 10 MG/ML IV BOLUS
INTRAVENOUS | Status: AC
  Filled 2014-06-08: qty 20

## 2014-06-08 MED ORDER — LORATADINE 10 MG PO TABS
10.0000 mg | ORAL_TABLET | Freq: Every day | ORAL | Status: DC
  Administered 2014-06-08 – 2014-06-12 (×5): 10 mg via ORAL
  Filled 2014-06-08 (×5): qty 1

## 2014-06-08 MED ORDER — RIVAROXABAN 10 MG PO TABS
10.0000 mg | ORAL_TABLET | Freq: Every day | ORAL | Status: DC
  Administered 2014-06-09 – 2014-06-12 (×4): 10 mg via ORAL
  Filled 2014-06-08 (×5): qty 1

## 2014-06-08 MED ORDER — LACTATED RINGERS IV SOLN
INTRAVENOUS | Status: DC
  Administered 2014-06-08 (×2): via INTRAVENOUS
  Administered 2014-06-08: 1000 mL via INTRAVENOUS
  Administered 2014-06-08: 13:00:00 via INTRAVENOUS

## 2014-06-08 MED ORDER — ACETAMINOPHEN 325 MG PO TABS
650.0000 mg | ORAL_TABLET | Freq: Four times a day (QID) | ORAL | Status: DC | PRN
  Administered 2014-06-09 – 2014-06-12 (×8): 650 mg via ORAL
  Filled 2014-06-08 (×8): qty 2

## 2014-06-08 MED ORDER — ONDANSETRON HCL 4 MG/2ML IJ SOLN
INTRAMUSCULAR | Status: AC
  Filled 2014-06-08: qty 2

## 2014-06-08 MED ORDER — BUPIVACAINE HCL (PF) 0.25 % IJ SOLN
INTRAMUSCULAR | Status: AC
  Filled 2014-06-08: qty 30

## 2014-06-08 MED ORDER — BUPIVACAINE HCL (PF) 0.5 % IJ SOLN
INTRAMUSCULAR | Status: DC | PRN
  Administered 2014-06-08: 2.5 mL

## 2014-06-08 MED ORDER — ONDANSETRON HCL 4 MG/2ML IJ SOLN
INTRAMUSCULAR | Status: DC | PRN
  Administered 2014-06-08: 4 mg via INTRAVENOUS

## 2014-06-08 MED ORDER — SODIUM CHLORIDE 0.9 % IJ SOLN
INTRAMUSCULAR | Status: DC | PRN
  Administered 2014-06-08: 40 mL via INTRAVENOUS

## 2014-06-08 MED ORDER — LIDOCAINE HCL (CARDIAC) 20 MG/ML IV SOLN
INTRAVENOUS | Status: AC
  Filled 2014-06-08: qty 5

## 2014-06-08 MED ORDER — METOPROLOL SUCCINATE ER 50 MG PO TB24
50.0000 mg | ORAL_TABLET | Freq: Every evening | ORAL | Status: DC
  Administered 2014-06-08 – 2014-06-11 (×4): 50 mg via ORAL
  Filled 2014-06-08 (×5): qty 1

## 2014-06-08 MED ORDER — FLUTICASONE PROPIONATE 50 MCG/ACT NA SUSP
2.0000 | Freq: Every evening | NASAL | Status: DC
  Administered 2014-06-09: 2 via NASAL
  Filled 2014-06-08: qty 16

## 2014-06-08 MED ORDER — DEXAMETHASONE SODIUM PHOSPHATE 10 MG/ML IJ SOLN
10.0000 mg | Freq: Once | INTRAMUSCULAR | Status: AC
  Administered 2014-06-09: 10 mg via INTRAVENOUS
  Filled 2014-06-08: qty 1

## 2014-06-08 MED ORDER — DOCUSATE SODIUM 100 MG PO CAPS
100.0000 mg | ORAL_CAPSULE | Freq: Two times a day (BID) | ORAL | Status: DC
  Administered 2014-06-08 – 2014-06-12 (×8): 100 mg via ORAL

## 2014-06-08 MED ORDER — PHENOL 1.4 % MT LIQD: 1.0000 | OROMUCOSAL | Status: DC | PRN

## 2014-06-08 MED ORDER — PROPOFOL INFUSION 10 MG/ML OPTIME
INTRAVENOUS | Status: DC | PRN
  Administered 2014-06-08: 100 ug/kg/min via INTRAVENOUS

## 2014-06-08 MED ORDER — FLEET ENEMA 7-19 GM/118ML RE ENEM: 1.0000 | ENEMA | Freq: Once | RECTAL | Status: AC | PRN

## 2014-06-08 MED ORDER — METOCLOPRAMIDE HCL 5 MG/ML IJ SOLN: 5.0000 mg | Freq: Three times a day (TID) | INTRAMUSCULAR | Status: DC | PRN

## 2014-06-08 MED ORDER — FENTANYL CITRATE 0.05 MG/ML IJ SOLN
INTRAMUSCULAR | Status: DC | PRN
  Administered 2014-06-08: 100 ug via INTRAVENOUS

## 2014-06-08 MED ORDER — FENTANYL CITRATE 0.05 MG/ML IJ SOLN
INTRAMUSCULAR | Status: AC
  Filled 2014-06-08: qty 2

## 2014-06-08 MED ORDER — PHENYLEPHRINE HCL 10 MG/ML IJ SOLN
10.0000 mg | INTRAVENOUS | Status: DC | PRN
  Administered 2014-06-08: 10 ug/min via INTRAVENOUS

## 2014-06-08 MED ORDER — CEFAZOLIN SODIUM-DEXTROSE 2-3 GM-% IV SOLR
2.0000 g | INTRAVENOUS | Status: AC
  Administered 2014-06-08: 2 g via INTRAVENOUS

## 2014-06-08 MED ORDER — BUPIVACAINE LIPOSOME 1.3 % IJ SUSP
20.0000 mL | Freq: Once | INTRAMUSCULAR | Status: DC
  Filled 2014-06-08: qty 20

## 2014-06-08 MED ORDER — CEFAZOLIN SODIUM-DEXTROSE 2-3 GM-% IV SOLR
2.0000 g | Freq: Four times a day (QID) | INTRAVENOUS | Status: AC
  Administered 2014-06-08 (×2): 2 g via INTRAVENOUS
  Filled 2014-06-08 (×2): qty 50

## 2014-06-08 MED ORDER — BISACODYL 10 MG RE SUPP: 10.0000 mg | Freq: Every day | RECTAL | Status: DC | PRN

## 2014-06-08 MED ORDER — DIPHENHYDRAMINE HCL 12.5 MG/5ML PO ELIX: 12.5000 mg | ORAL_SOLUTION | ORAL | Status: DC | PRN

## 2014-06-08 MED ORDER — OXYCODONE HCL 5 MG PO TABS
5.0000 mg | ORAL_TABLET | ORAL | Status: DC | PRN
  Administered 2014-06-08 – 2014-06-09 (×3): 5 mg via ORAL
  Filled 2014-06-08 (×3): qty 1

## 2014-06-08 MED ORDER — CHLORHEXIDINE GLUCONATE 4 % EX LIQD: 60.0000 mL | Freq: Once | CUTANEOUS | Status: DC

## 2014-06-08 MED ORDER — METHOCARBAMOL 500 MG PO TABS
500.0000 mg | ORAL_TABLET | Freq: Four times a day (QID) | ORAL | Status: DC | PRN
Start: 2014-06-08 — End: 2014-06-12
  Administered 2014-06-08 – 2014-06-12 (×10): 500 mg via ORAL
  Filled 2014-06-08 (×10): qty 1

## 2014-06-08 MED ORDER — TRAMADOL HCL 50 MG PO TABS
50.0000 mg | ORAL_TABLET | Freq: Four times a day (QID) | ORAL | Status: DC | PRN
  Administered 2014-06-11 – 2014-06-12 (×3): 50 mg via ORAL
  Filled 2014-06-08 (×3): qty 1

## 2014-06-08 MED ORDER — MIDAZOLAM HCL 2 MG/2ML IJ SOLN
INTRAMUSCULAR | Status: AC
  Filled 2014-06-08: qty 2

## 2014-06-08 MED ORDER — IRBESARTAN 300 MG PO TABS
300.0000 mg | ORAL_TABLET | Freq: Every day | ORAL | Status: DC
  Administered 2014-06-11 – 2014-06-12 (×2): 300 mg via ORAL
  Filled 2014-06-08 (×4): qty 1

## 2014-06-08 SURGICAL SUPPLY — 45 items
BAG ZIPLOCK 12X15 (MISCELLANEOUS) IMPLANT
BLADE EXTENDED COATED 6.5IN (ELECTRODE) ×3 IMPLANT
BLADE SAG 18X100X1.27 (BLADE) ×3 IMPLANT
CAPT HIP TOTAL 2 ×3 IMPLANT
CLOSURE STERI-STRIP 1/4X4 (GAUZE/BANDAGES/DRESSINGS) ×3 IMPLANT
CLOSURE WOUND 1/2 X4 (GAUZE/BANDAGES/DRESSINGS) ×1
COVER PERINEAL POST (MISCELLANEOUS) ×3 IMPLANT
CUP ACET PINNACLE SECTR 50MM (Hips) IMPLANT
DECANTER SPIKE VIAL GLASS SM (MISCELLANEOUS) ×3 IMPLANT
DRAPE C-ARM 42X120 X-RAY (DRAPES) ×3 IMPLANT
DRAPE STERI IOBAN 125X83 (DRAPES) ×3 IMPLANT
DRAPE U-SHAPE 47X51 STRL (DRAPES) ×9 IMPLANT
DRSG ADAPTIC 3X8 NADH LF (GAUZE/BANDAGES/DRESSINGS) ×3 IMPLANT
DRSG MEPILEX BORDER 4X4 (GAUZE/BANDAGES/DRESSINGS) ×3 IMPLANT
DRSG MEPILEX BORDER 4X8 (GAUZE/BANDAGES/DRESSINGS) ×3 IMPLANT
DURAPREP 26ML APPLICATOR (WOUND CARE) ×3 IMPLANT
ELECT REM PT RETURN 9FT ADLT (ELECTROSURGICAL) ×3
ELECTRODE REM PT RTRN 9FT ADLT (ELECTROSURGICAL) ×1 IMPLANT
EVACUATOR 1/8 PVC DRAIN (DRAIN) ×3 IMPLANT
FACESHIELD WRAPAROUND (MASK) ×12 IMPLANT
GLOVE BIO SURGEON STRL SZ7.5 (GLOVE) ×3 IMPLANT
GLOVE BIO SURGEON STRL SZ8 (GLOVE) ×6 IMPLANT
GLOVE BIOGEL PI IND STRL 8 (GLOVE) ×2 IMPLANT
GLOVE BIOGEL PI INDICATOR 8 (GLOVE) ×4
GOWN STRL REUS W/TWL LRG LVL3 (GOWN DISPOSABLE) ×3 IMPLANT
GOWN STRL REUS W/TWL XL LVL3 (GOWN DISPOSABLE) ×3 IMPLANT
KIT BASIN OR (CUSTOM PROCEDURE TRAY) ×3 IMPLANT
LINER MARATHON 32 50 (Hips) IMPLANT
LINER MARATHON NEUT +4X52X32 (Hips) IMPLANT
NDL SAFETY ECLIPSE 18X1.5 (NEEDLE) ×2 IMPLANT
NEEDLE HYPO 18GX1.5 SHARP (NEEDLE) ×4
PACK TOTAL JOINT (CUSTOM PROCEDURE TRAY) ×3 IMPLANT
PINNACLE SECTOR CUP 50MM (Hips) IMPLANT
SCREW 6.5MMX25MM (Screw) IMPLANT
SCREW 6.5MMX30MM (Screw) IMPLANT
STRIP CLOSURE SKIN 1/2X4 (GAUZE/BANDAGES/DRESSINGS) ×2 IMPLANT
SUT ETHIBOND NAB CT1 #1 30IN (SUTURE) ×3 IMPLANT
SUT MNCRL AB 4-0 PS2 18 (SUTURE) ×3 IMPLANT
SUT VIC AB 2-0 CT1 27 (SUTURE) ×4
SUT VIC AB 2-0 CT1 TAPERPNT 27 (SUTURE) ×2 IMPLANT
SUT VLOC 180 0 24IN GS25 (SUTURE) ×3 IMPLANT
SYR 20CC LL (SYRINGE) ×3 IMPLANT
SYR 50ML LL SCALE MARK (SYRINGE) ×3 IMPLANT
TOWEL OR 17X26 10 PK STRL BLUE (TOWEL DISPOSABLE) ×3 IMPLANT
TRAY FOLEY CATH 14FRSI W/METER (CATHETERS) ×3 IMPLANT

## 2014-06-08 NOTE — Anesthesia Preprocedure Evaluation (Addendum)
Anesthesia Evaluation  Patient identified by MRN, date of birth, ID band Patient awake    Reviewed: Allergy & Precautions, H&P , NPO status , Patient's Chart, lab work & pertinent test results, reviewed documented beta blocker date and time   Airway Mallampati: II  TM Distance: >3 FB Neck ROM: Full    Dental no notable dental hx. (+) Teeth Intact, Dental Advisory Given   Pulmonary neg pulmonary ROS,  breath sounds clear to auscultation  Pulmonary exam normal       Cardiovascular hypertension, Pt. on medications and Pt. on home beta blockers Rhythm:Regular Rate:Normal     Neuro/Psych  Headaches, negative psych ROS   GI/Hepatic negative GI ROS, Neg liver ROS,   Endo/Other  negative endocrine ROS  Renal/GU negative Renal ROS  negative genitourinary   Musculoskeletal   Abdominal   Peds  Hematology negative hematology ROS (+)   Anesthesia Other Findings   Reproductive/Obstetrics negative OB ROS                            Anesthesia Physical Anesthesia Plan  ASA: II  Anesthesia Plan: Spinal   Post-op Pain Management:    Induction: Intravenous  Airway Management Planned: Simple Face Mask  Additional Equipment:   Intra-op Plan:   Post-operative Plan:   Informed Consent: I have reviewed the patients History and Physical, chart, labs and discussed the procedure including the risks, benefits and alternatives for the proposed anesthesia with the patient or authorized representative who has indicated his/her understanding and acceptance.   Dental advisory given  Plan Discussed with: CRNA  Anesthesia Plan Comments:         Anesthesia Quick Evaluation

## 2014-06-08 NOTE — Anesthesia Procedure Notes (Signed)
Spinal Patient location during procedure: OR Start time: 06/08/2014 10:49 AM End time: 06/08/2014 10:54 AM Staffing Resident/CRNA: Lollie Sails Performed by: anesthesiologist and resident/CRNA  Preanesthetic Checklist Completed: patient identified, site marked, surgical consent, pre-op evaluation, timeout performed, IV checked, risks and benefits discussed and monitors and equipment checked Spinal Block Patient position: sitting Prep: Betadine Patient monitoring: heart rate, continuous pulse ox and blood pressure Approach: midline Location: L2-3 Injection technique: single-shot Needle Needle type: Sprotte  Needle gauge: 24 G Needle length: 9 cm Additional Notes Expiration date of kit checked and confirmed. Patient tolerated procedure well, without complications.  Clear CSF return - one attempt.   Lot #[id number]   Exp 2017-07

## 2014-06-08 NOTE — Plan of Care (Signed)
Problem: Consults Goal: Diagnosis- Total Joint Replacement Right anterior hip     

## 2014-06-08 NOTE — Anesthesia Postprocedure Evaluation (Signed)
  Anesthesia Post-op Note  Patient: Veronica Maynard  Procedure(s) Performed: Procedure(s): RIGHT TOTAL HIP ARTHROPLASTY ANTERIOR APPROACH (Right)  Patient Location: PACU  Anesthesia Type: Spinal/MAC  Level of Consciousness: awake and alert   Airway and Oxygen Therapy: Patient Spontanous Breathing  Post-op Pain: none  Post-op Assessment: Post-op Vital signs reviewed, Patient's Cardiovascular Status Stable and Respiratory Function Stable. No residual motor block.  Post-op Vital Signs: Reviewed  Filed Vitals:   06/08/14 1430  BP: 114/48  Pulse: 65  Temp: 36.3 C  Resp: 17    Complications: No apparent anesthesia complications

## 2014-06-08 NOTE — Interval H&P Note (Signed)
History and Physical Interval Note:  06/08/2014 10:00 AM  Veronica Maynard  has presented today for surgery, with the diagnosis of right hip osteoarthritis  The various methods of treatment have been discussed with the patient and family. After consideration of risks, benefits and other options for treatment, the patient has consented to  Procedure(s): RIGHT TOTAL HIP ARTHROPLASTY ANTERIOR APPROACH (Right) as a surgical intervention .  The patient's history has been reviewed, patient examined, no change in status, stable for surgery.  I have reviewed the patient's chart and labs.  Questions were answered to the patient's satisfaction.     Gearlean Alf

## 2014-06-08 NOTE — Transfer of Care (Signed)
Immediate Anesthesia Transfer of Care Note  Patient: Veronica Maynard  Procedure(s) Performed: Procedure(s): RIGHT TOTAL HIP ARTHROPLASTY ANTERIOR APPROACH (Right)  Patient Location: PACU  Anesthesia Type:Spinal  Level of Consciousness: awake, alert  and oriented  Airway & Oxygen Therapy: Patient Spontanous Breathing and Patient connected to face mask oxygen  Post-op Assessment: Report given to PACU RN and Post -op Vital signs reviewed and stable  Post vital signs: Reviewed and stable  Complications: No apparent anesthesia complications

## 2014-06-08 NOTE — Progress Notes (Signed)
Utilization review completed.  

## 2014-06-08 NOTE — Op Note (Signed)
OPERATIVE REPORT  PREOPERATIVE DIAGNOSIS: Osteoarthritis of the Right hip.   POSTOPERATIVE DIAGNOSIS: Osteoarthritis of the Right  hip.   PROCEDURE: Right total hip arthroplasty, anterior approach.   SURGEON: Gaynelle Arabian, MD   ASSISTANT: Arlee Muslim, PA-C  ANESTHESIA:  Spinal  ESTIMATED BLOOD LOSS:-1000 ml   DRAINS: Hemovac x1.   COMPLICATIONS: None   CONDITION: PACU - hemodynamically stable.   BRIEF CLINICAL NOTE: Veronica Maynard is a 74 y.o. female who has advanced end-  stage arthritis of her Right  hip with progressively worsening pain and  dysfunction.The patient has failed nonoperative management and presents for  total hip arthroplasty.   PROCEDURE IN DETAIL: After successful administration of spinal  anesthetic, the traction boots for the [facility] bed were placed on both  feet and the patient was placed onto the [facility] bed, boots placed into the leg  holders. The Right hip was then isolated from the perineum with plastic  drapes and prepped and draped in the usual sterile fashion. ASIS and  greater trochanter were marked and a oblique incision was made, starting  at about 1 cm lateral and 2 cm distal to the ASIS and coursing towards  the anterior cortex of the femur. The skin was cut with a 10 blade  through subcutaneous tissue to the level of the fascia overlying the  tensor fascia lata muscle. The fascia was then incised in line with the  incision at the junction of the anterior third and posterior 2/3rd. The  muscle was teased off the fascia and then the interval between the TFL  and the rectus was developed. The Hohmann retractor was then placed at  the top of the femoral neck over the capsule. The vessels overlying the  capsule were cauterized and the fat on top of the capsule was removed.  A Hohmann retractor was then placed anterior underneath the rectus  femoris to give exposure to the entire anterior capsule. A T-shaped  capsulotomy was performed.  The edges were tagged and the femoral head  was identified.       Osteophytes are removed off the superior acetabulum.  The femoral neck was then cut in situ with an oscillating saw. Traction  was then applied to the left lower extremity utilizing the [facility]  traction. The femoral head was then removed. Retractors were placed  around the acetabulum and then circumferential removal of the labrum was  performed. Osteophytes were also removed. Reaming starts at 45 mm to  medialize and  Increased in 2 mm increments to 49 mm. We reamed in  approximately 40 degrees of abduction, 20 degrees anteversion. A 50 mm  pinnacle acetabular shell was then impacted in anatomic position under  fluoroscopic guidance. I was not satisfied with the stability of the implant and attempted to place 2 screws but they tilted the shell into abduction. A second attempt was made and the same position occurred. I removed the 50 shell and placed a 52 mm Pinnacle acetabular shell with better purchase and placed 2 additional dome screws and the cup remained stable in the desired anatomic position with excellent purchase.  A 32 mm neutral + 4 marathon liner was then  placed into the acetabular shell.       The femoral lift was then placed along the lateral aspect of the femur  just distal to the vastus ridge. The leg was  externally rotated and capsule  was stripped off the inferior aspect  of the femoral neck down to the  level of the lesser trochanter, this was done with electrocautery. The femur was lifted after this was performed. The  leg was then placed and extended in adducted position to essentially delivering the femur. We also removed the capsule superiorly and the  piriformis from the piriformis fossa to gain excellent exposure of the  proximal femur. Rongeur was used to remove some cancellous bone to get  into the lateral portion of the proximal femur for placement of the  initial starter reamer. The starter broaches was  placed  the starter broach  and was shown to go down the center of the canal. Broaching  with the  Corail system was then performed starting at size 8, coursing  Up to size 12. A size 12 had excellent torsional and rotational  and axial stability. The trial standard offset neck was then placed  with a 32 + 1 trial head. The hip was then reduced. We confirmed that  the stem was in the canal both on AP and lateral x-rays. It also has excellent sizing. The hip was reduced with outstanding stability through full extension, full external rotation,  and then flexion in adduction internal rotation. AP pelvis was taken  and the leg lengths were measured and found to be exactly equal. Hip  was then dislocated again and the femoral head and neck removed. The  femoral broach was removed. Size 12 Corail stem with a standard offset  neck was then impacted into the femur following native anteversion. Has  excellent purchase in the canal. Excellent torsional and rotational and  axial stability. It is confirmed to be in the canal on AP and lateral  fluoroscopic views. The 32 + 1 ceramic head was placed and the hip  reduced with outstanding stability. Again AP pelvis was taken and it  confirmed that the leg lengths were equal. The wound was then copiously  irrigated with saline solution and the capsule reattached and repaired  with Ethibond suture.  20 mL of Exparel mixed with 50 mL of saline then additional 20 ml of .25% Bupivicaine injected into the capsule and into the edge of the tensor fascia lata as well as subcutaneous tissue. The fascia overlying the tensor fascia lata was  then closed with a running #1 V-Loc. Subcu was closed with interrupted  2-0 Vicryl and subcuticular running 4-0 Monocryl. Incision was cleaned  and dried. Steri-Strips and a bulky sterile dressing applied. Hemovac  drain was hooked to suction and then he was awakened and transported to  recovery in stable condition.        Please  note that a surgical assistant was a medical necessity for this procedure to perform it in a safe and expeditious manner. Assistant was necessary to provide appropriate retraction of vital neurovascular structures and to prevent femoral fracture and allow for anatomic placement of the prosthesis.  Gaynelle Arabian, M.D.

## 2014-06-08 NOTE — Progress Notes (Signed)
X-ray results noted 

## 2014-06-08 NOTE — Progress Notes (Signed)
Portable AP Pelvis X-RAY done. 

## 2014-06-09 ENCOUNTER — Encounter (HOSPITAL_COMMUNITY): Payer: Self-pay | Admitting: Orthopedic Surgery

## 2014-06-09 LAB — CBC
HEMATOCRIT: 21.4 % — AB (ref 36.0–46.0)
Hemoglobin: 7.4 g/dL — ABNORMAL LOW (ref 12.0–15.0)
MCH: 30.6 pg (ref 26.0–34.0)
MCHC: 34.6 g/dL (ref 30.0–36.0)
MCV: 88.4 fL (ref 78.0–100.0)
Platelets: 181 10*3/uL (ref 150–400)
RBC: 2.42 MIL/uL — ABNORMAL LOW (ref 3.87–5.11)
RDW: 13.3 % (ref 11.5–15.5)
WBC: 7.3 10*3/uL (ref 4.0–10.5)

## 2014-06-09 LAB — BASIC METABOLIC PANEL
ANION GAP: 8 (ref 5–15)
BUN: 21 mg/dL (ref 6–23)
CALCIUM: 8.2 mg/dL — AB (ref 8.4–10.5)
CHLORIDE: 98 meq/L (ref 96–112)
CO2: 24 mmol/L (ref 19–32)
Creatinine, Ser: 0.88 mg/dL (ref 0.50–1.10)
GFR calc Af Amer: 73 mL/min — ABNORMAL LOW (ref >90)
GFR calc non Af Amer: 63 mL/min — ABNORMAL LOW (ref >90)
GLUCOSE: 139 mg/dL — AB (ref 70–99)
Potassium: 4.6 mmol/L (ref 3.5–5.1)
Sodium: 130 mmol/L — ABNORMAL LOW (ref 135–145)

## 2014-06-09 LAB — PREPARE RBC (CROSSMATCH)

## 2014-06-09 MED ORDER — RIVAROXABAN 10 MG PO TABS: 10.0000 mg | ORAL_TABLET | Freq: Every day | ORAL | Status: DC

## 2014-06-09 MED ORDER — TRAMADOL HCL 50 MG PO TABS: 50.0000 mg | ORAL_TABLET | Freq: Four times a day (QID) | ORAL | Status: DC | PRN

## 2014-06-09 MED ORDER — FUROSEMIDE 10 MG/ML IJ SOLN
10.0000 mg | Freq: Once | INTRAMUSCULAR | Status: AC
  Administered 2014-06-09: 10 mg via INTRAVENOUS
  Filled 2014-06-09: qty 1

## 2014-06-09 MED ORDER — METHOCARBAMOL 500 MG PO TABS: 500.0000 mg | ORAL_TABLET | Freq: Four times a day (QID) | ORAL | Status: DC | PRN

## 2014-06-09 MED ORDER — HYDROCODONE-ACETAMINOPHEN 5-325 MG PO TABS
1.0000 | ORAL_TABLET | ORAL | Status: DC | PRN
  Administered 2014-06-09 – 2014-06-10 (×4): 1 via ORAL
  Filled 2014-06-09 (×4): qty 1

## 2014-06-09 MED ORDER — HYDROCODONE-ACETAMINOPHEN 5-325 MG PO TABS: 1.0000 | ORAL_TABLET | ORAL | Status: DC | PRN

## 2014-06-09 MED ORDER — SODIUM CHLORIDE 0.9 % IV SOLN: Freq: Once | INTRAVENOUS | Status: DC

## 2014-06-09 NOTE — Progress Notes (Signed)
OT Cancellation Note  Patient Details Name: Veronica Maynard MRN: [id number] DOB: 1940/07/21   Cancelled Treatment:    Reason Eval/Treat Not Completed: Other (comment).  Noted pt to get blood transfusion.  Will check later today or tomorrow am.   [facility] 06/09/2014, 7:47 AM  Lesle Chris, OTR/L [phone] 06/09/2014

## 2014-06-09 NOTE — Progress Notes (Signed)
   Subjective: 1 Day Post-Op Procedure(s) (LRB): RIGHT TOTAL HIP ARTHROPLASTY ANTERIOR APPROACH (Right) Patient reports pain as mild.   Patient seen in rounds by Dr. Wynelle Link. HGB down to 7.4 this morning. Will give blood.  Discussed with Dr. Wynelle Link.  Up with therapy today.  Did not tolerate the Oxy and was changed to [place].  Will see how they do with the med change. Patient is well, but has had some minor complaints of pain in the knee, requiring pain medications but had to change the oral pain pill. We will start therapy today.  Plan is to go Home after hospital stay. If the patient does well with therapy following the blood, possible home tomorrow if meets all goals. Recheck labs in the morning.   Objective: Vital signs in last 24 hours: Temp:  [97.3 F (36.3 C)-99.3 F (37.4 C)] 98.4 F (36.9 C) (01/21 0538) Pulse Rate:  [64-84] 69 (01/21 0538) Resp:  [12-17] 16 (01/21 0538) BP: (98-140)/(44-72) 113/44 mmHg (01/21 0538) SpO2:  [99 %-100 %] 100 % (01/21 0538) Weight:  [66.679 kg (147 lb)] 66.679 kg (147 lb) (01/20 1450)  Intake/Output from previous day:  Intake/Output Summary (Last 24 hours) at 06/09/14 0843 Last data filed at 06/09/14 0800  Gross per 24 hour  Intake   5415 ml  Output   1955 ml  Net   3460 ml    Intake/Output this shift: Total I/O In: 360 [P.O.:360] Out: -   Labs:  Recent Labs  06/09/14 0534  HGB 7.4*    Recent Labs  06/09/14 0534  WBC 7.3  RBC 2.42*  HCT 21.4*  PLT 181    Recent Labs  06/09/14 0534  NA 130*  K 4.6  CL 98  CO2 24  BUN 21  CREATININE 0.88  GLUCOSE 139*  CALCIUM 8.2*   No results for input(s): LABPT, INR in the last 72 hours.  EXAM General - Patient is Alert, Appropriate and Oriented Extremity - Neurovascular intact Sensation intact distally Dorsiflexion/Plantar flexion intact Dressing - dressing C/D/I Motor Function - intact, moving foot and toes well on exam.  Hemovac pulled without difficulty.  Past  Medical History  Diagnosis Date  . Hypertension   . Arthritis   . Headache     due to hormone replacement    Assessment/Plan: 1 Day Post-Op Procedure(s) (LRB): RIGHT TOTAL HIP ARTHROPLASTY ANTERIOR APPROACH (Right) Principal Problem:   OA (osteoarthritis) of hip  Estimated body mass index is 23.02 kg/(m^2) as calculated from the following:   Height as of this encounter: 5\' 7"  (1.702 m).   Weight as of this encounter: 66.679 kg (147 lb). Advance diet Up with therapy Continue foley due to blood transfusion; will continue until blood transfusion complete Plan for discharge tomorrow Discharge home with home health  DVT Prophylaxis - Xarelto Weight Bearing As Tolerated right Leg Hemovac Pulled Begin Therapy Blood today. Keep foley this morning for blood today.  May remove foley two hours after blood completed. Possible home tomorrow if does well with therapy. RX's added to the chart for anticipation of possible discharge tomorrow.  Arlee Muslim, PA-C Orthopaedic Surgery 06/09/2014, 8:43 AM

## 2014-06-09 NOTE — Progress Notes (Signed)
PT Cancellation Note  Patient Details Name: Veronica Maynard MRN: [id number] DOB: Sep 22, 1940   Cancelled Treatment:    Reason Eval/Treat Not Completed: Medical issues which prohibited therapy (Hgb 7.4, Per RN, to start PRBC soon, will check back after 1st unit)   Christyanna Mckeon,KATHrine E 06/09/2014, 11:07 AM Carmelia Bake, PT, DPT 06/09/2014 Pager: [phone]

## 2014-06-09 NOTE — Progress Notes (Signed)
05/20/14  Nursing 0645  Dr. Maureen Ralphs notified of hgb  7.4 this am.

## 2014-06-09 NOTE — Discharge Summary (Signed)
Physician Discharge Summary   Patient ID: Veronica Maynard MRN: [id number] DOB/AGE: 10-19-1940 74 y.o.  Admit date: 06/08/2014 Discharge date: 06-12-2014  Primary Diagnosis:  Osteoarthritis of the Right hip.  Admission Diagnoses:  Past Medical History  Diagnosis Date  . Hypertension   . Arthritis   . Headache     due to hormone replacement   Discharge Diagnoses:   Principal Problem:   OA (osteoarthritis) of hip  Estimated body mass index is 23.02 kg/(m^2) as calculated from the following:   Height as of this encounter: _0  (1.702 m).   Weight as of this encounter: 66.679 kg (147 lb).  Procedure(s) (LRB): RIGHT TOTAL HIP ARTHROPLASTY ANTERIOR APPROACH (Right)   Consults: None  HPI: Veronica Maynard is a 74 y.o. female who has advanced end-  stage arthritis of her Right hip with progressively worsening pain and  dysfunction.The patient has failed nonoperative management and presents for  total hip arthroplasty.  Laboratory Data: Admission on 06/08/2014, Discharged on 06/12/2014  Component Date Value Ref Range Status  . WBC 06/09/2014 7.3  4.0 - 10.5 K/uL Final  . RBC 06/09/2014 2.42* 3.87 - 5.11 MIL/uL Final  . Hemoglobin 06/09/2014 7.4* 12.0 - 15.0 g/dL Final  . HCT 06/09/2014 21.4* 36.0 - 46.0 % Final  . MCV 06/09/2014 88.4  78.0 - 100.0 fL Final  . MCH 06/09/2014 30.6  26.0 - 34.0 pg Final  . MCHC 06/09/2014 34.6  30.0 - 36.0 g/dL Final  . RDW 06/09/2014 13.3  11.5 - 15.5 % Final  . Platelets 06/09/2014 181  150 - 400 K/uL Final  . Sodium 06/09/2014 130* 135 - 145 mmol/L Final   Please note change in reference range.  . Potassium 06/09/2014 4.6  3.5 - 5.1 mmol/L Final   Please note change in reference range.  . Chloride 06/09/2014 98  96 - 112 mEq/L Final  . CO2 06/09/2014 24  19 - 32 mmol/L Final  . Glucose, Bld 06/09/2014 139* 70 - 99 mg/dL Final  . BUN 06/09/2014 21  6 - 23 mg/dL Final  . Creatinine, Ser 06/09/2014 0.88  0.50 - 1.10 mg/dL Final  . Calcium  06/09/2014 8.2* 8.4 - 10.5 mg/dL Final  . GFR calc non Af Amer 06/09/2014 63* >90 mL/min Final  . GFR calc Af Amer 06/09/2014 73* >90 mL/min Final   Comment: (NOTE) The eGFR has been calculated using the CKD EPI equation. This calculation has not been validated in all clinical situations. eGFR's persistently <90 mL/min signify possible Chronic Kidney Disease.   . Anion gap 06/09/2014 8  5 - 15 Final  . Order Confirmation 06/09/2014 ORDER PROCESSED BY BLOOD BANK   Final  . WBC 06/10/2014 9.8  4.0 - 10.5 K/uL Final  . RBC 06/10/2014 3.65* 3.87 - 5.11 MIL/uL Final  . Hemoglobin 06/10/2014 10.8* 12.0 - 15.0 g/dL Final   Comment: DELTA CHECK NOTED POST TRANSFUSION SPECIMEN   . HCT 06/10/2014 31.5* 36.0 - 46.0 % Final  . MCV 06/10/2014 86.3  78.0 - 100.0 fL Final  . MCH 06/10/2014 29.6  26.0 - 34.0 pg Final  . MCHC 06/10/2014 34.3  30.0 - 36.0 g/dL Final  . RDW 06/10/2014 14.5  11.5 - 15.5 % Final  . Platelets 06/10/2014 174  150 - 400 K/uL Final  . Sodium 06/10/2014 134* 135 - 145 mmol/L Final   Please note change in reference range.  . Potassium 06/10/2014 4.8  3.5 - 5.1 mmol/L Final   Please note  change in reference range.  . Chloride 06/10/2014 100  96 - 112 mEq/L Final  . CO2 06/10/2014 27  19 - 32 mmol/L Final  . Glucose, Bld 06/10/2014 122* 70 - 99 mg/dL Final  . BUN 06/10/2014 20  6 - 23 mg/dL Final  . Creatinine, Ser 06/10/2014 0.77  0.50 - 1.10 mg/dL Final  . Calcium 06/10/2014 8.6  8.4 - 10.5 mg/dL Final  . GFR calc non Af Amer 06/10/2014 81* >90 mL/min Final  . GFR calc Af Amer 06/10/2014 >90  >90 mL/min Final   Comment: (NOTE) The eGFR has been calculated using the CKD EPI equation. This calculation has not been validated in all clinical situations. eGFR's persistently <90 mL/min signify possible Chronic Kidney Disease.   . Anion gap 06/10/2014 7  5 - 15 Final  . WBC 06/11/2014 7.5  4.0 - 10.5 K/uL Final  . RBC 06/11/2014 3.67* 3.87 - 5.11 MIL/uL Final  . Hemoglobin  06/11/2014 11.0* 12.0 - 15.0 g/dL Final  . HCT 06/11/2014 31.8* 36.0 - 46.0 % Final  . MCV 06/11/2014 86.6  78.0 - 100.0 fL Final  . MCH 06/11/2014 30.0  26.0 - 34.0 pg Final  . MCHC 06/11/2014 34.6  30.0 - 36.0 g/dL Final  . RDW 06/11/2014 14.8  11.5 - 15.5 % Final  . Platelets 06/11/2014 160  150 - 400 K/uL Final  Hospital Outpatient Visit on 06/01/2014  Component Date Value Ref Range Status  . MRSA, PCR 06/01/2014 NEGATIVE  NEGATIVE Final  . Staphylococcus aureus 06/01/2014 NEGATIVE  NEGATIVE Final   Comment:        The Xpert SA Assay (FDA approved for NASAL specimens in patients over 50 years of age), is one component of a comprehensive surveillance program.  Test performance has been validated by [employer] for patients greater than or equal to 11 year old. It is not intended to diagnose infection nor to guide or monitor treatment.   Marland Kitchen aPTT 06/01/2014 31  24 - 37 seconds Final  . WBC 06/01/2014 6.4  4.0 - 10.5 K/uL Final  . RBC 06/01/2014 4.10  3.87 - 5.11 MIL/uL Final  . Hemoglobin 06/01/2014 12.3  12.0 - 15.0 g/dL Final  . HCT 06/01/2014 37.2  36.0 - 46.0 % Final  . MCV 06/01/2014 90.7  78.0 - 100.0 fL Final  . MCH 06/01/2014 30.0  26.0 - 34.0 pg Final  . MCHC 06/01/2014 33.1  30.0 - 36.0 g/dL Final  . RDW 06/01/2014 13.5  11.5 - 15.5 % Final  . Platelets 06/01/2014 309  150 - 400 K/uL Final  . Sodium 06/01/2014 127* 135 - 145 mmol/L Final   Please note change in reference range.  . Potassium 06/01/2014 5.6* 3.5 - 5.1 mmol/L Final   Please note change in reference range.  . Chloride 06/01/2014 93* 96 - 112 mEq/L Final  . CO2 06/01/2014 28  19 - 32 mmol/L Final  . Glucose, Bld 06/01/2014 95  70 - 99 mg/dL Final  . BUN 06/01/2014 23  6 - 23 mg/dL Final  . Creatinine, Ser 06/01/2014 0.92  0.50 - 1.10 mg/dL Final  . Calcium 06/01/2014 9.2  8.4 - 10.5 mg/dL Final  . Total Protein 06/01/2014 7.6  6.0 - 8.3 g/dL Final  . Albumin 06/01/2014 4.4  3.5 - 5.2 g/dL Final  .  AST 06/01/2014 22  0 - 37 U/L Final  . ALT 06/01/2014 19  0 - 35 U/L Final  . Alkaline Phosphatase 06/01/2014 55  39 - 117 U/L Final  . Total Bilirubin 06/01/2014 0.4  0.3 - 1.2 mg/dL Final  . GFR calc non Af Amer 06/01/2014 60* >90 mL/min Final  . GFR calc Af Amer 06/01/2014 69* >90 mL/min Final   Comment: (NOTE) The eGFR has been calculated using the CKD EPI equation. This calculation has not been validated in all clinical situations. eGFR's persistently <90 mL/min signify possible Chronic Kidney Disease.   . Anion gap 06/01/2014 6  5 - 15 Final  . Prothrombin Time 06/01/2014 12.9  11.6 - 15.2 seconds Final  . INR 06/01/2014 0.96  0.00 - 1.49 Final  . ABO/RH(D) 06/01/2014 A POS   Final  . Antibody Screen 06/01/2014 NEG   Final  . Sample Expiration 06/01/2014 06/11/2014   Final  . Unit Number 06/01/2014 [id number]   Final  . Blood Component Type 06/01/2014 RED CELLS,LR   Final  . Unit division 06/01/2014 00   Final  . Status of Unit 06/01/2014 ISSUED,FINAL   Final  . Transfusion Status 06/01/2014 OK TO TRANSFUSE   Final  . Crossmatch Result 06/01/2014 Compatible   Final  . Unit Number 06/01/2014 [id number]   Final  . Blood Component Type 06/01/2014 RBC CPDA1, LR   Final  . Unit division 06/01/2014 00   Final  . Status of Unit 06/01/2014 ISSUED,FINAL   Final  . Transfusion Status 06/01/2014 OK TO TRANSFUSE   Final  . Crossmatch Result 06/01/2014 Compatible   Final  . Color, Urine 06/01/2014 YELLOW  YELLOW Final  . APPearance 06/01/2014 CLEAR  CLEAR Final  . Specific Gravity, Urine 06/01/2014 1.020  1.005 - 1.030 Final  . pH 06/01/2014 6.5  5.0 - 8.0 Final  . Glucose, UA 06/01/2014 NEGATIVE  NEGATIVE mg/dL Final  . Hgb urine dipstick 06/01/2014 NEGATIVE  NEGATIVE Final  . Bilirubin Urine 06/01/2014 NEGATIVE  NEGATIVE Final  . Ketones, ur 06/01/2014 NEGATIVE  NEGATIVE mg/dL Final  . Protein, ur 06/01/2014 NEGATIVE  NEGATIVE mg/dL Final  . Urobilinogen, UA 06/01/2014 0.2   0.0 - 1.0 mg/dL Final  . Nitrite 06/01/2014 NEGATIVE  NEGATIVE Final  . Leukocytes, UA 06/01/2014 SMALL* NEGATIVE Final  . Squamous Epithelial / LPF 06/01/2014 RARE  RARE Final  . WBC, UA 06/01/2014 7-10  <3 WBC/hpf Final  . RBC / HPF 06/01/2014 0-2  <3 RBC/hpf Final  . Bacteria, UA 06/01/2014 RARE  RARE Final  . Urine-Other 06/01/2014 MUCOUS PRESENT   Final  . ABO/RH(D) 06/01/2014 A POS   Final     X-Rays:Dg Chest 2 View  06/01/2014   CLINICAL DATA:  Right hip replacement.  Hypertension .  EXAM: CHEST  2 VIEW  COMPARISON:  None.  FINDINGS: Mediastinum and hilar structures normal. Lungs are clear. No pleural effusion or pneumothorax. Heart size normal. No acute bony abnormality.  IMPRESSION: No acute abnormality.   Electronically Signed   By: Marcello Moores  Register   On: 06/01/2014 15:21   Dg Pelvis Portable  06/08/2014   CLINICAL DATA:  74 year old female status post right hip surgery. Initial encounter.  EXAM: PORTABLE PELVIS 1-2 VIEWS  COMPARISON:  None.  FINDINGS: Portable AP view of the pelvis at 1312 hrs. Right total hip arthroplasty hardware in place and appears normally aligned. Nearby postoperative drain, and surrounding postoperative changes to the soft tissues. No other acute osseous abnormality identified.  IMPRESSION: Right total hip arthroplasty with no adverse features.   Electronically Signed   By: Lars Pinks M.D.   On: 06/08/2014 13:53  Dg C-arm 61-120 Min-no Report  06/08/2014   CLINICAL DATA: right hip   C-ARM 61-120 MINUTES  Fluoroscopy was utilized by the requesting physician.  No radiographic  interpretation.     EKG: Orders placed or performed during the hospital encounter of 06/01/14  . EKG 12-Lead  . EKG 12-Lead     Hospital Course: Patient was admitted to [facility] and taken to the OR and underwent the above state procedure without complications.  Patient tolerated the procedure well and was later transferred to the recovery room and then to the orthopaedic  floor for postoperative care.  They were given PO and IV analgesics for pain control following their surgery.  They were given 24 hours of postoperative antibiotics of  Anti-infectives    Start     Dose/Rate Route Frequency Ordered Stop   06/08/14 1700  ceFAZolin (ANCEF) IVPB 2 g/50 mL premix     2 g100 mL/hr over 30 Minutes Intravenous Every 6 hours 06/08/14 1455 06/08/14 2257   06/08/14 0822  ceFAZolin (ANCEF) IVPB 2 g/50 mL premix     2 g100 mL/hr over 30 Minutes Intravenous On call to O.R. 06/08/14 5577 06/08/14 1058     and started on DVT prophylaxis in the form of Xarelto.   PT and OT were ordered for total hip protocol.  The patient was allowed to be WBAT with therapy. Discharge planning was consulted to help with postop disposition and equipment needs.  Patient had a good night on the evening of surgery.  They started to get up OOB with therapy on day one.  Hemovac drain was pulled without difficulty. HGB was down to 7.4 so transfused two units of blood. Also changed the oxy to norco.  Continued to work with therapy into day two.  Dressing was changed on day two and the incision was healing well.  By day three, the patient had progressed with therapy and was improving with their goals.  Incision was healing well.  Patient was seen in rounds again on POD 4 and was ready to go home.  Discharge home with home health Diet - Cardiac diet Follow up - in 2 weeks Activity - WBAT Disposition - Home Condition Upon Discharge - Good D/C Meds - See DC Summary DVT Prophylaxis - Xarelto      Discharge Instructions    Call MD / Call 911    Complete by:  As directed   If you experience chest pain or shortness of breath, CALL 911 and be transported to the hospital emergency room.  If you develope a fever above 101 F, pus (white drainage) or increased drainage or redness at the wound, or calf pain, call your surgeon's office.     Change dressing    Complete by:  As directed   You may change your  dressing dressing daily with sterile 4 x 4 inch gauze dressing and paper tape.  Do not submerge the incision under water.     Constipation Prevention    Complete by:  As directed   Drink plenty of fluids.  Prune juice may be helpful.  You may use a stool softener, such as Colace (over the counter) 100 mg twice a day.  Use MiraLax (over the counter) for constipation as needed.     Diet - low sodium heart healthy    Complete by:  As directed      Discharge instructions    Complete by:  As directed   Pick up stool  softner and laxative for home use following surgery while on pain medications. Do not submerge incision under water. Please use good hand washing techniques while changing dressing each day. May shower starting three days after surgery. Please use a clean towel to pat the incision dry following showers. Continue to use ice for pain and swelling after surgery. Do not use any lotions or creams on the incision until instructed by your surgeon.  Total Hip Protocol.  Take Xarelto for two and a half more weeks, then discontinue Xarelto. Once the patient has completed the Xarelto, they may resume the 81 mg Aspirin.  Postoperative Constipation Protocol  Constipation - defined medically as fewer than three stools per week and severe constipation as less than one stool per week.  One of the most common issues patients have following surgery is constipation.  Even if you have a regular bowel pattern at home, your normal regimen is likely to be disrupted due to multiple reasons following surgery.  Combination of anesthesia, postoperative narcotics, change in appetite and fluid intake all can affect your bowels.  In order to avoid complications following surgery, here are some recommendations in order to help you during your recovery period.  Colace (docusate) - Pick up an over-the-counter form of Colace or another stool softener and take twice a day as long as you are requiring postoperative  pain medications.  Take with a full glass of water daily.  If you experience loose stools or diarrhea, hold the colace until you stool forms back up.  If your symptoms do not get better within 1 week or if they get worse, check with your doctor.  Dulcolax (bisacodyl) - Pick up over-the-counter and take as directed by the product packaging as needed to assist with the movement of your bowels.  Take with a full glass of water.  Use this product as needed if not relieved by Colace only.   MiraLax (polyethylene glycol) - Pick up over-the-counter to have on hand.  MiraLax is a solution that will increase the amount of water in your bowels to assist with bowel movements.  Take as directed and can mix with a glass of water, juice, soda, coffee, or tea.  Take if you go more than two days without a movement. Do not use MiraLax more than once per day. Call your doctor if you are still constipated or irregular after using this medication for 7 days in a row.  If you continue to have problems with postoperative constipation, please contact the office for further assistance and recommendations.  If you experience "the worst abdominal pain ever" or develop nausea or vomiting, please contact the office immediatly for further recommendations for treatment.     Do not sit on low chairs, stoools or toilet seats, as it may be difficult to get up from low surfaces    Complete by:  As directed      Driving restrictions    Complete by:  As directed   No driving until released by the physician.     Increase activity slowly as tolerated    Complete by:  As directed      Lifting restrictions    Complete by:  As directed   No lifting until released by the physician.     Patient may shower    Complete by:  As directed   You may shower without a dressing once there is no drainage.  Do not wash over the wound.  If drainage remains, do not  shower until drainage stops.     TED hose    Complete by:  As directed   Use  stockings (TED hose) for 3 weeks on both leg(s).  You may remove them at night for sleeping.     Weight bearing as tolerated    Complete by:  As directed   Laterality:  right  Extremity:  Lower            Medication List    STOP taking these medications        alendronate 70 MG tablet  Commonly known as:  FOSAMAX     ascorbic acid 500 MG tablet  Commonly known as:  VITAMIN C     aspirin 81 MG tablet     calcium citrate-vitamin D 315-200 MG-UNIT per tablet  Commonly known as:  CITRACAL+D     cholecalciferol 1000 UNITS tablet  Commonly known as:  VITAMIN D     folic acid 248 MCG tablet  Commonly known as:  FOLVITE     omega-3 fish oil 1000 MG Caps capsule  Commonly known as:  MAXEPA     vitamin B-12 1000 MCG tablet  Commonly known as:  CYANOCOBALAMIN      TAKE these medications        acetaminophen 500 MG tablet  Commonly known as:  TYLENOL  Take 1,000 mg by mouth every 6 (six) hours as needed for mild pain or moderate pain.     BENEFIBER PO  Take 1 packet by mouth 2 (two) times daily.     fluticasone 50 MCG/ACT nasal spray  Commonly known as:  FLONASE  Place 2 sprays into both nostrils every evening.     HYDROcodone-acetaminophen 5-325 MG per tablet  Commonly known as:  NORCO/VICODIN  Take 1-2 tablets by mouth every 4 (four) hours as needed for moderate pain.     loratadine 10 MG tablet  Commonly known as:  CLARITIN  Take 10 mg by mouth daily.     methocarbamol 500 MG tablet  Commonly known as:  ROBAXIN  Take 1 tablet (500 mg total) by mouth every 6 (six) hours as needed for muscle spasms.     metoprolol succinate 50 MG 24 hr tablet  Commonly known as:  TOPROL-XL  Take 50 mg by mouth every evening. Take with or immediately following a meal.     olmesartan 40 MG tablet  Commonly known as:  BENICAR  Take 40 mg by mouth every morning.     pravastatin 20 MG tablet  Commonly known as:  PRAVACHOL  Take 20 mg by mouth daily.     rivaroxaban 10 MG  Tabs tablet  Commonly known as:  XARELTO  - Take 1 tablet (10 mg total) by mouth daily with breakfast. Take Xarelto for two and a half more weeks, then discontinue Xarelto.  - Once the patient has completed the Xarelto, they may resume the 81 mg Aspirin.     traMADol 50 MG tablet  Commonly known as:  ULTRAM  Take 1-2 tablets (50-100 mg total) by mouth every 6 (six) hours as needed (mild pain).       Follow-up Information    Follow up with Gearlean Alf, MD. Schedule an appointment as soon as possible for a visit in 2 weeks.   Specialty:  Orthopedic Surgery   Why:  Call office at [phone] to setup follow up appointment with Dr. Wynelle Link two weeks after surgery.   Contact information:   [place]  [address] [place] [address] [phone]       Follow up with [facility].   Why:  home health physical therapy   Contact information:   [place] [address] [place] [address] [phone]       Signed: Arlee Muslim, PA-C Orthopaedic Surgery 06/21/2014, 10:04 AM

## 2014-06-09 NOTE — Care Management Note (Signed)
    Page 1 of 2   06/09/2014     3:09:36 PM CARE MANAGEMENT NOTE 06/09/2014  Patient:  Veronica Maynard, Veronica Maynard   Account Number:  [id number]  Date Initiated:  06/09/2014  Documentation initiated by:  [facility]  Subjective/Objective Assessment:   adm: RIGHT TOTAL HIP ARTHROPLASTY ANTERIOR APPROACH (Right)     Action/Plan:   discharge planning   Anticipated DC Date:  06/11/2014   Anticipated DC Plan:  [place]  CM consult      [facility] Choice  HOME HEALTH   Choice offered to / List presented to:  C-1 Patient   DME arranged  NA      DME agency  NA     [facility] arranged  HH-2 PT      [facility]   Status of service:  Completed, signed off Medicare Important Message given?   (If response is "NO", the following Medicare IM given date fields will be blank) Date Medicare IM given:   Medicare IM given by:   Date Additional Medicare IM given:   Additional Medicare IM given by:    Discharge Disposition:  [place]  Per UR Regulation:    If discussed at Long Length of Stay Meetings, dates discussed:    Comments:  06/09/14 15:00 Cm met with pt in room to offer choice of home health agency. Pt chooses [facility] to render HHPT. address and contact information verified by pt.  No DME needed.  referral given to [employer], Tim.  No other CM needs were communicated. Mariane Masters, BSN, [place].

## 2014-06-09 NOTE — Discharge Instructions (Addendum)
Dr. Gaynelle Arabian Total Joint Specialist [facility] [address] [place],  [address] [phone]    ANTERIOR APPROACH TOTAL HIP REPLACEMENT POSTOPERATIVE DIRECTIONS   Hip Rehabilitation, Guidelines Following Surgery  The results of a hip operation are greatly improved after range of motion and muscle strengthening exercises. Follow all safety measures which are given to protect your hip. If any of these exercises cause increased pain or swelling in your joint, decrease the amount until you are comfortable again. Then slowly increase the exercises. Call your caregiver if you have problems or questions.  HOME CARE INSTRUCTIONS  Most of the following instructions are designed to prevent the dislocation of your new hip.  Remove items at home which could result in a fall. This includes throw rugs or furniture in walking pathways.  Continue medications as instructed at time of discharge.  You may have some home medications which will be placed on hold until you complete the course of blood thinner medication.  You may start showering once you are discharged home but do not submerge the incision under water. Just pat the incision dry and apply a dry gauze dressing on daily. Do not put on socks or shoes without following the instructions of your caregivers.  Sit on high chairs which makes it easier to stand.  Sit on chairs with arms. Use the chair arms to help push yourself up when arising.  Keep your leg on the side of the operation out in front of you when standing up.  Arrange for the use of a toilet seat elevator so you are not sitting low.    Walk with walker as instructed.  You may resume a sexual relationship in one month or when given the OK by your caregiver.  Use walker as long as suggested by your caregivers.  You may put full weight on your legs and walk as much as is comfortable. Avoid periods of inactivity such as sitting longer than an hour  when not asleep. This helps prevent blood clots.  You may return to work once you are cleared by Engineer, production.  Do not drive a car for 6 weeks or until released by your surgeon.  Do not drive while taking narcotics.  Wear elastic stockings for three weeks following surgery during the day but you may remove then at night.  Make sure you keep all of your appointments after your operation with all of your doctors and caregivers. You should call the office at the above phone number and make an appointment for approximately two weeks after the date of your surgery. Change the dressing daily and reapply a dry dressing each time. Please pick up a stool softener and laxative for home use as long as you are requiring pain medications.  ICE to the affected hip every three hours for 30 minutes at a time and then as needed for pain and swelling.  Continue to use ice on the hip for pain and swelling from surgery. You may notice swelling that will progress down to the foot and ankle.  This is normal after surgery.  Elevate the leg when you are not up walking on it.   It is important for you to complete the blood thinner medication as prescribed by your doctor.  Continue to use the breathing machine which will help keep your temperature down.  It is common for your temperature to cycle up and down following surgery, especially at night when you are not up moving around  and exerting yourself.  The breathing machine keeps your lungs expanded and your temperature down.  RANGE OF MOTION AND STRENGTHENING EXERCISES  These exercises are designed to help you keep full movement of your hip joint. Follow your caregiver's or physical therapist's instructions. Perform all exercises about fifteen times, three times per day or as directed. Exercise both hips, even if you have had only one joint replacement. These exercises can be done on a training (exercise) mat, on the floor, on a table or on a bed. Use whatever works the best  and is most comfortable for you. Use music or television while you are exercising so that the exercises are a pleasant break in your day. This will make your life better with the exercises acting as a break in routine you can look forward to.  Lying on your back, slowly slide your foot toward your buttocks, raising your knee up off the floor. Then slowly slide your foot back down until your leg is straight again.  Lying on your back spread your legs as far apart as you can without causing discomfort.  Lying on your side, raise your upper leg and foot straight up from the floor as far as is comfortable. Slowly lower the leg and repeat.  Lying on your back, tighten up the muscle in the front of your thigh (quadriceps muscles). You can do this by keeping your leg straight and trying to raise your heel off the floor. This helps strengthen the largest muscle supporting your knee.  Lying on your back, tighten up the muscles of your buttocks both with the legs straight and with the knee bent at a comfortable angle while keeping your heel on the floor.   SKILLED REHAB INSTRUCTIONS: If the patient is transferred to a skilled rehab facility following release from the hospital, a list of the current medications will be sent to the facility for the patient to continue.  When discharged from the skilled rehab facility, please have the facility set up the patient's [place] prior to being released. Also, the skilled facility will be responsible for providing the patient with their medications at time of release from the facility to include their pain medication, the muscle relaxants, and their blood thinner medication. If the patient is still at the rehab facility at time of the two week follow up appointment, the skilled rehab facility will also need to assist the patient in arranging follow up appointment in our office and any transportation needs.  MAKE SURE YOU:  Understand these instructions.    Will watch your condition.  Will get help right away if you are not doing well or get worse.  Pick up stool softner and laxative for home use following surgery while on pain medications. Do not submerge incision under water. Please use good hand washing techniques while changing dressing each day. May shower starting three days after surgery. Please use a clean towel to pat the incision dry following showers. Continue to use ice for pain and swelling after surgery. Do not use any lotions or creams on the incision until instructed by your surgeon. Total Hip Protocol.  Take Xarelto for two and a half more weeks, then discontinue Xarelto. Once the patient has completed the Xarelto, they may resume the 81 mg Aspirin.  Postoperative Constipation Protocol  Constipation - defined medically as fewer than three stools per week and severe constipation as less than one stool per week.  One of the most common issues patients  have following surgery is constipation.  Even if you have a regular bowel pattern at home, your normal regimen is likely to be disrupted due to multiple reasons following surgery.  Combination of anesthesia, postoperative narcotics, change in appetite and fluid intake all can affect your bowels.  In order to avoid complications following surgery, here are some recommendations in order to help you during your recovery period.  Colace (docusate) - Pick up an over-the-counter form of Colace or another stool softener and take twice a day as long as you are requiring postoperative pain medications.  Take with a full glass of water daily.  If you experience loose stools or diarrhea, hold the colace until you stool forms back up.  If your symptoms do not get better within 1 week or if they get worse, check with your doctor.  Dulcolax (bisacodyl) - Pick up over-the-counter and take as directed by the product packaging as needed to assist with the movement of your bowels.  Take with a full  glass of water.  Use this product as needed if not relieved by Colace only.   MiraLax (polyethylene glycol) - Pick up over-the-counter to have on hand.  MiraLax is a solution that will increase the amount of water in your bowels to assist with bowel movements.  Take as directed and can mix with a glass of water, juice, soda, coffee, or tea.  Take if you go more than two days without a movement. Do not use MiraLax more than once per day. Call your doctor if you are still constipated or irregular after using this medication for 7 days in a row.  If you continue to have problems with postoperative constipation, please contact the office for further assistance and recommendations.  If you experience "the worst abdominal pain ever" or develop nausea or vomiting, please contact the office immediatly for further recommendations for treatment.  Information on my medicine - XARELTO (Rivaroxaban)  This medication education was reviewed with me or my healthcare representative as part of my discharge preparation.  The pharmacist that spoke with me during my hospital stay was:  Haden Cavenaugh A, RPH  Why was Xarelto prescribed for you? Xarelto was prescribed for you to reduce the risk of blood clots forming after orthopedic surgery. The medical term for these abnormal blood clots is venous thromboembolism (VTE).  What do you need to know about xarelto ? Take your Xarelto ONCE DAILY at the same time every day. You may take it either with or without food.  If you have difficulty swallowing the tablet whole, you may crush it and mix in applesauce just prior to taking your dose.  Take Xarelto exactly as prescribed by your doctor and DO NOT stop taking Xarelto without talking to the doctor who prescribed the medication.  Stopping without other VTE prevention medication to take the place of Xarelto may increase your risk of developing a clot.  After discharge, you should have regular check-up appointments  with your healthcare provider that is prescribing your Xarelto.    What do you do if you miss a dose? If you miss a dose, take it as soon as you remember on the same day then continue your regularly scheduled once daily regimen the next day. Do not take two doses of Xarelto on the same day.   Important Safety Information A possible side effect of Xarelto is bleeding. You should call your healthcare provider right away if you experience any of the following: ? Bleeding from an  injury or your nose that does not stop. ? Unusual colored urine (red or dark brown) or unusual colored stools (red or black). ? Unusual bruising for unknown reasons. ? A serious fall or if you hit your head (even if there is no bleeding).  Some medicines may interact with Xarelto and might increase your risk of bleeding while on Xarelto. To help avoid this, consult your healthcare provider or pharmacist prior to using any new prescription or non-prescription medications, including herbals, vitamins, non-steroidal anti-inflammatory drugs (NSAIDs) and supplements.  This website has more information on Xarelto: [email or web address]

## 2014-06-09 NOTE — Evaluation (Signed)
Physical Therapy Evaluation Patient Details Name: Veronica Maynard MRN: [id number] DOB: 31-Jul-1940 Today's Date: 06/09/2014   History of Present Illness  Pt is a 74 year old female s/p R direct anterior THA  Clinical Impression  Pt is s/p R THA resulting in the deficits listed below (see PT Problem List).  Pt will benefit from skilled PT to increase their independence and safety with mobility to allow discharge to the venue listed below.  Pt mobilizing well POD #1 and plans to d/c home with spouse.  Pt with decreased Hgb this morning and s/p one unit prior to session, no c/o dizziness.     Follow Up Recommendations Home health PT    Equipment Recommendations  None recommended by PT    Recommendations for Other Services       Precautions / Restrictions Precautions Precautions: None Restrictions Other Position/Activity Restrictions: WBAT      Mobility  Bed Mobility Overal bed mobility: Needs Assistance Bed Mobility: Supine to Sit     Supine to sit: Min assist     General bed mobility comments: verbal cues for technique, assist for R LE  Transfers Overall transfer level: Needs assistance Equipment used: Rolling walker (2 wheeled) Transfers: Sit to/from Stand Sit to Stand: Min assist         General transfer comment: assist to rise and steady, verbal cues for safe technique  Ambulation/Gait Ambulation/Gait assistance: Min guard Ambulation Distance (Feet): 50 Feet Assistive device: Rolling walker (2 wheeled) Gait Pattern/deviations: Step-to pattern;Antalgic     General Gait Details: verbal cues for sequence, step length, RW distance  Stairs            Wheelchair Mobility    Modified Rankin (Stroke Patients Only)       Balance                                             Pertinent Vitals/Pain Pain Assessment: 0-10 Pain Score: 7  Pain Location: R hip with ambulation Pain Descriptors / Indicators: Sore;Aching Pain  Intervention(s): Limited activity within patient's tolerance;Monitored during session;Premedicated before session;Repositioned;Ice applied    Home Living Family/patient expects to be discharged to:: Private residence Living Arrangements: Spouse/significant other   Type of Home: House Home Access: Stairs to enter Entrance Stairs-Rails: Right Entrance Stairs-Number of Steps: 3 Home Layout: One level Home Equipment: Environmental consultant - 2 wheels      Prior Function Level of Independence: Independent               Hand Dominance        Extremity/Trunk Assessment               Lower Extremity Assessment: RLE deficits/detail RLE Deficits / Details: decreased functional strength observed       Communication   Communication: No difficulties  Cognition Arousal/Alertness: Awake/alert Behavior During Therapy: WFL for tasks assessed/performed Overall Cognitive Status: Within Functional Limits for tasks assessed                      General Comments      Exercises Total Joint Exercises Ankle Circles/Pumps: AROM;Both;15 reps Quad Sets: AROM;Both;15 reps Towel Squeeze: AROM;Both;15 reps Short Arc QuadSinclair Ship;Right;15 reps Heel Slides: AAROM;Right;10 reps Hip ABduction/ADduction: AAROM;Right;10 reps      Assessment/Plan    PT Assessment Patient needs continued PT services  PT Diagnosis Difficulty walking;Acute  pain   PT Problem List Decreased mobility;Decreased range of motion;Decreased strength;Pain;Decreased knowledge of use of DME  PT Treatment Interventions Functional mobility training;Stair training;Gait training;DME instruction;Patient/family education;Therapeutic activities;Therapeutic exercise   PT Goals (Current goals can be found in the Care Plan section) Acute Rehab PT Goals PT Goal Formulation: With patient Time For Goal Achievement: 06/16/14 Potential to Achieve Goals: Good    Frequency 7X/week   Barriers to discharge        Co-evaluation                End of Session Equipment Utilized During Treatment: Gait belt Activity Tolerance: Patient tolerated treatment well Patient left: in chair;with call bell/phone within reach Nurse Communication: Mobility status         Time: 1421-1446 PT Time Calculation (min) (ACUTE ONLY): 25 min   Charges:   PT Evaluation $Initial PT Evaluation Tier I: 1 Procedure PT Treatments $Gait Training: 8-22 mins $Therapeutic Exercise: 8-22 mins   PT G Codes:        Kaleya Douse,KATHrine E 06/09/2014, 3:18 PM Carmelia Bake, PT, DPT 06/09/2014 Pager: [phone]

## 2014-06-10 LAB — TYPE AND SCREEN
ABO/RH(D): A POS
Antibody Screen: NEGATIVE
UNIT DIVISION: 0
Unit division: 0

## 2014-06-10 LAB — BASIC METABOLIC PANEL
Anion gap: 7 (ref 5–15)
BUN: 20 mg/dL (ref 6–23)
CO2: 27 mmol/L (ref 19–32)
Calcium: 8.6 mg/dL (ref 8.4–10.5)
Chloride: 100 mEq/L (ref 96–112)
Creatinine, Ser: 0.77 mg/dL (ref 0.50–1.10)
GFR calc Af Amer: >90 mL/min (ref >90)
GFR calc non Af Amer: 81 mL/min — ABNORMAL LOW (ref >90)
GLUCOSE: 122 mg/dL — AB (ref 70–99)
Potassium: 4.8 mmol/L (ref 3.5–5.1)
SODIUM: 134 mmol/L — AB (ref 135–145)

## 2014-06-10 LAB — CBC
HEMATOCRIT: 31.5 % — AB (ref 36.0–46.0)
Hemoglobin: 10.8 g/dL — ABNORMAL LOW (ref 12.0–15.0)
MCH: 29.6 pg (ref 26.0–34.0)
MCHC: 34.3 g/dL (ref 30.0–36.0)
MCV: 86.3 fL (ref 78.0–100.0)
Platelets: 174 10*3/uL (ref 150–400)
RBC: 3.65 MIL/uL — AB (ref 3.87–5.11)
RDW: 14.5 % (ref 11.5–15.5)
WBC: 9.8 10*3/uL (ref 4.0–10.5)

## 2014-06-10 NOTE — Clinical Documentation Improvement (Signed)
06/10/14 per Prog Note.Marland KitchenMarland Kitchen"Patient seen in rounds by Dr. Wynelle Link. HGB down to 7.4 this morning. Will give blood. Discussed with Dr. Wynelle Link.".Marland KitchenMarland Kitchen Tx: 06/09/14 received 2 units PRBCs & " Recheck labs in the morning."... 06/08/14: per OP Note..."PROCEDURE: Right total hip arthroplasty, anterior approach. "...ESTIMATED BLOOD LOSS:-1000 ml.".Marland KitchenMarland Kitchen  For accurate Dx specificity & severity and can noted abnormal lab value and treatment provided be linked with condition being eval'd, mon'd & tx'd. Thank you   Abnormal Lab and/or Testing Results: See above note  Treatment provided: See above note  Possible Clinical Conditions:  Thank you, Ermelinda Das, RN, BSN, [place] Certified Clinical Documentation [place]  Pgr: [phone]

## 2014-06-10 NOTE — Progress Notes (Signed)
Physical Therapy Treatment Patient Details Name: REATHA SUR MRN: [id number] DOB: [birth year] Today's Date: 06/10/2014    History of Present Illness Pt is a 74 year old female s/p R direct anterior THA    PT Comments    POD # 2 am session.  Pt OOB in recliner.  Did well with her OT session pt reported.  Assisted out of recliner to amb in hallway however after 25 feet pt c/o increased dizziness and immediate need to sit.  RN present at this time and vitals taken.  (see vs section).  Pt placed in recliner supine then assisted back to bed per RN request.  PT session following OT session may have been too much for her.  Will return later today.  Follow Up Recommendations  Home health PT     Equipment Recommendations  None recommended by PT    Recommendations for Other Services       Precautions / Restrictions Precautions Precautions: None Restrictions Weight Bearing Restrictions: No Other Position/Activity Restrictions: WBAT    Mobility  Bed Mobility Overal bed mobility: Needs Assistance Bed Mobility: Sit to Supine     Supine to sit: Min assist     General bed mobility comments: assisted back to bed/Min Assist for R LE only  Assisted pt back to bed per nursing request due to BP.  Transfers Overall transfer level: Needs assistance Equipment used: Rolling walker (2 wheeled) Transfers: Sit to/from Stand Sit to Stand: Min guard         General transfer comment: good safety cognition and use of hands  Ambulation/Gait Ambulation/Gait assistance: Supervision;Min guard Ambulation Distance (Feet): 25 Feet Assistive device: Rolling walker (2 wheeled) Gait Pattern/deviations: Step-to pattern Gait velocity: decreased   General Gait Details: ,25% verbal cues for sequence, step length, RW distance.  Gait distance limited by c/o dizziness.  BP taken see vital signs section.   Stairs            Wheelchair Mobility    Modified Rankin (Stroke Patients Only)        Balance                                    Cognition Arousal/Alertness: Awake/alert Behavior During Therapy: WFL for tasks assessed/performed Overall Cognitive Status: Within Functional Limits for tasks assessed                      Exercises      General Comments        Pertinent Vitals/Pain Pain Assessment: 0-10 Pain Score: 5  Pain Location: R hip Pain Descriptors / Indicators: Sore Pain Intervention(s): Monitored during session;Premedicated before session;Repositioned;Ice applied    Home Living Family/patient expects to be discharged to:: Private residence Living Arrangements: Spouse/significant other Available Help at Discharge: Family         Home Equipment: Bedside commode;Tub bench      Prior Function Level of Independence: Independent          PT Goals (current goals can now be found in the care plan section) Progress towards PT goals: Progressing toward goals    Frequency  7X/week    PT Plan      Co-evaluation             End of Session Equipment Utilized During Treatment: Gait belt Activity Tolerance: Patient limited by fatigue Patient left: in bed;with call bell/phone within reach  Time: 7846-9629 PT Time Calculation (min) (ACUTE ONLY): 25 min  Charges:  $Gait Training: 8-22 mins $Therapeutic Activity: 8-22 mins                    G Codes:      Rica Koyanagi  PTA [facility]  Acute  Rehab Pager      [phone]

## 2014-06-10 NOTE — Evaluation (Signed)
Occupational Therapy Evaluation Patient Details Name: Veronica Maynard MRN: [id number] DOB: 1940/08/14 Today's Date: 06/10/2014    History of Present Illness Pt is a 74 year old female s/p R direct anterior THA   Clinical Impression   Pt was admitted for the above surgery.  All education was completed.  No further OT is needed at this time.  Pt has AE kit, tub bench and assist from husband.    Follow Up Recommendations  No OT follow up;Supervision/Assistance - 24 hour    Equipment Recommendations  None recommended by OT    Recommendations for Other Services       Precautions / Restrictions Precautions Precautions: None Restrictions Other Position/Activity Restrictions: WBAT      Mobility Bed Mobility               General bed mobility comments: pt on 3:1 in bathroom  Transfers   Equipment used: Rolling walker (2 wheeled) Transfers: Sit to/from Stand Sit to Stand: Min guard         General transfer comment: for safety    Balance                                            ADL Overall ADL's : Needs assistance/impaired                         Toilet Transfer: Ambulation;RW;Min guard             General ADL Comments: Pt completed ADL from recliner:  ambulated back from bathroom with min guard.  Pt has AE from when her husband had TKA.  Reviewed and practiced with sock aide.  Pt verbalizes understanding of tub bench transfer.  UB adls performed with set up and LB with min guard, sit to stand.     Vision                     Perception     Praxis      Pertinent Vitals/Pain Pain Score: 4  Pain Location: R hip with weightbearing Pain Descriptors / Indicators: Sore Pain Intervention(s): Limited activity within patient's tolerance;Monitored during session;Premedicated before session;Repositioned     Hand Dominance     Extremity/Trunk Assessment Upper Extremity Assessment Upper Extremity Assessment: Overall  WFL for tasks assessed           Communication Communication Communication: No difficulties   Cognition Arousal/Alertness: Awake/alert Behavior During Therapy: WFL for tasks assessed/performed Overall Cognitive Status: Within Functional Limits for tasks assessed                     General Comments       Exercises       Shoulder Instructions      Home Living Family/patient expects to be discharged to:: Private residence Living Arrangements: Spouse/significant other Available Help at Discharge: Family               Bathroom Shower/Tub: Tub/shower unit Shower/tub characteristics: Architectural technologist: Standard     Home Equipment: Bedside commode;Tub bench          Prior Functioning/Environment Level of Independence: Independent             OT Diagnosis: Generalized weakness   OT Problem List:     OT Treatment/Interventions:      OT Goals(Current  goals can be found in the care plan section)    OT Frequency:     Barriers to D/C:            Co-evaluation              End of Session    Activity Tolerance: Patient tolerated treatment well Patient left: in chair;with call bell/phone within reach   Time: 2956-2130 OT Time Calculation (min): 29 min Charges:  OT General Charges $OT Visit: 1 Procedure OT Evaluation $Initial OT Evaluation Tier I: 1 Procedure OT Treatments $Self Care/Home Management : 23-37 mins G-Codes:    Landan Fedie 2014/06/12, 9:34 AM   Lesle Chris, OTR/L [phone] June 12, 2014

## 2014-06-10 NOTE — Progress Notes (Signed)
Physical Therapy Treatment Patient Details Name: LORELLE MACALUSO MRN: [id number] DOB: Nov 21, 1940 Today's Date: 06/10/2014    History of Present Illness Pt is a 74 year old female s/p R direct anterior THA    PT Comments    POD # 2 pm session.  Assisted pt OOB to amb to BR then in hallway an increased distance.  No c/o dizziness.  Practiced going up/down 2 steps forward with one rail.  Returned to room to perform THR TE's followed by ICE.   Follow Up Recommendations  Home health PT     Equipment Recommendations  None recommended by PT    Recommendations for Other Services       Precautions / Restrictions Precautions Precautions: None Restrictions Weight Bearing Restrictions: No Other Position/Activity Restrictions: WBAT    Mobility  Bed Mobility Overal bed mobility: Needs Assistance Bed Mobility: Supine to Sit     Supine to sit: Min guard     General bed mobility comments: increased time  Transfers Overall transfer level: Needs assistance Equipment used: Rolling walker (2 wheeled) Transfers: Sit to/from Stand Sit to Stand: Min guard         General transfer comment: good safety cognition and use of hands  Ambulation/Gait Ambulation/Gait assistance: Supervision;Min guard Ambulation Distance (Feet): 75 Feet Assistive device: Rolling walker (2 wheeled) Gait Pattern/deviations: Step-to pattern Gait velocity: decreased   General Gait Details: pt prefers to lead off with "good" LE during gait.   Tolerated increased distance.  No c/o dizziness.  BP standing was 135/74   Stairs  up/down 2 steps forward/step to one L rail at [employer] and one [employer]          Wheelchair Mobility    Modified Rankin (Stroke Patients Only)       Balance                                    Cognition Arousal/Alertness: Awake/alert Behavior During Therapy: WFL for tasks assessed/performed Overall Cognitive Status: Within Functional Limits for tasks  assessed                      Exercises   Total Hip Replacement TE's 10 reps ankle pumps 10 reps knee presses 10 reps heel slides 10 reps SAQ's 10 reps ABD Followed by ICE     General Comments        Pertinent Vitals/Pain Pain Assessment: 0-10 Pain Score: 5  Pain Location: R hip Pain Descriptors / Indicators: Sore Pain Intervention(s): Monitored during session;Premedicated before session;Repositioned;Ice applied    Home Living                      Prior Function            PT Goals (current goals can now be found in the care plan section) Progress towards PT goals: Progressing toward goals    Frequency  7X/week    PT Plan      Co-evaluation             End of Session Equipment Utilized During Treatment: Gait belt Activity Tolerance: Patient tolerated treatment well Patient left: in chair;with call bell/phone within reach     Time: 1250-1320 PT Time Calculation (min) (ACUTE ONLY): 30 min  Charges:  $Gait Training: 8-22 mins $Therapeutic Exercise: 8-22 mins  G Codes:      Rica Koyanagi  PTA [facility]  Acute  Rehab Pager      [phone]

## 2014-06-10 NOTE — Progress Notes (Signed)
   Subjective: 2 Days Post-Op Procedure(s) (LRB): RIGHT TOTAL HIP ARTHROPLASTY ANTERIOR APPROACH (Right) Patient reports pain as moderate.   Patient seen in rounds without Dr. Wynelle Link. Patient is having some trouble with muscle spasms in her right leg. No issues overnight. She reports that she only worked with PT once yesterday. Tolerated blood transfusion well.  Plan is to go Home after hospital stay.  Objective: Vital signs in last 24 hours: Temp:  [97.2 F (36.2 C)-99.1 F (37.3 C)] 98.5 F (36.9 C) (01/22 0556) Pulse Rate:  [68-82] 78 (01/22 0556) Resp:  [15-18] 16 (01/22 0556) BP: (97-149)/(35-69) 136/69 mmHg (01/22 0556) SpO2:  [97 %-100 %] 98 % (01/22 0556)  Intake/Output from previous day:  Intake/Output Summary (Last 24 hours) at 06/10/14 0635 Last data filed at 06/10/14 0557  Gross per 24 hour  Intake   1549 ml  Output   3150 ml  Net  -1601 ml    Intake/Output this shift: Total I/O In: 120 [P.O.:120] Out: 1450 [Urine:1450]  Labs:  Recent Labs  06/09/14 0534 06/10/14 0535  HGB 7.4* 10.8*    Recent Labs  06/09/14 0534 06/10/14 0535  WBC 7.3 9.8  RBC 2.42* 3.65*  HCT 21.4* 31.5*  PLT 181 174    Recent Labs  06/09/14 0534 06/10/14 0535  NA 130* 134*  K 4.6 4.8  CL 98 100  CO2 24 27  BUN 21 20  CREATININE 0.88 0.77  GLUCOSE 139* 122*  CALCIUM 8.2* 8.6    EXAM General - Patient is Alert and Oriented Extremity - Neurologically intact Intact pulses distally Dorsiflexion/Plantar flexion intact Compartment soft Dressing/Incision - clean, dry, no drainage Motor Function - intact, moving foot and toes well on exam.   Past Medical History  Diagnosis Date  . Hypertension   . Arthritis   . Headache     due to hormone replacement    Assessment/Plan: 2 Days Post-Op Procedure(s) (LRB): RIGHT TOTAL HIP ARTHROPLASTY ANTERIOR APPROACH (Right) Principal Problem:   OA (osteoarthritis) of hip  Estimated body mass index is 23.02 kg/(m^2) as  calculated from the following:   Height as of this encounter: 5\' 7"  (1.702 m).   Weight as of this encounter: 66.679 kg (147 lb). Advance diet Up with therapy D/C IV fluids Discharge home with home health when travel possible  DVT Prophylaxis - Xarelto Weight Bearing As Tolerated    Continue PT today. DC home if weather permits travel.   Ardeen Jourdain, PA-C Orthopaedic Surgery 06/10/2014, 6:35 AM

## 2014-06-11 LAB — CBC
HEMATOCRIT: 31.8 % — AB (ref 36.0–46.0)
HEMOGLOBIN: 11 g/dL — AB (ref 12.0–15.0)
MCH: 30 pg (ref 26.0–34.0)
MCHC: 34.6 g/dL (ref 30.0–36.0)
MCV: 86.6 fL (ref 78.0–100.0)
Platelets: 160 10*3/uL (ref 150–400)
RBC: 3.67 MIL/uL — ABNORMAL LOW (ref 3.87–5.11)
RDW: 14.8 % (ref 11.5–15.5)
WBC: 7.5 10*3/uL (ref 4.0–10.5)

## 2014-06-11 NOTE — Progress Notes (Signed)
Physical Therapy Treatment Patient Details Name: Veronica Maynard MRN: [id number] DOB: Sep 06, 1940 Today's Date: 06/11/2014    History of Present Illness Pt is a 74 year old female s/p R direct anterior THA    PT Comments    Patient progressing well  Follow Up Recommendations  Home health PT     Equipment Recommendations  None recommended by PT    Recommendations for Other Services       Precautions / Restrictions Precautions Precautions: Fall    Mobility  Bed Mobility   Bed Mobility: Supine to Sit;Sit to Supine     Supine to sit: Supervision Sit to supine: Supervision   General bed mobility comments: use of sheet to self assist R le  Transfers   Equipment used: Rolling walker (2 wheeled) Transfers: Sit to/from Stand Sit to Stand: Supervision         General transfer comment: good safety cognition and use of hands  Ambulation/Gait Ambulation/Gait assistance: Supervision Ambulation Distance (Feet): 250 Feet Assistive device: Rolling walker (2 wheeled) Gait Pattern/deviations: Step-to pattern;Step-through pattern     General Gait Details: pt prefers to lead off with "good" LE during gait.   Tolerated increased distance.  No c/o dizziness.  BP standing was 135/74   Stairs Stairs: Yes Stairs assistance: Min guard Stair Management: Step to pattern;One rail Left;With cane Number of Stairs: 2 General stair comments: cues for sequence  Wheelchair Mobility    Modified Rankin (Stroke Patients Only)       Balance                                    Cognition Arousal/Alertness: Awake/alert                          Exercises Total Joint Exercises Ankle Circles/Pumps: AROM;Both;15 reps Quad Sets: AROM;Both;15 reps Short Arc Quad: Right;15 reps;AROM Heel Slides: AAROM;Right;10 reps Hip ABduction/ADduction: AAROM;Right;10 reps    General Comments        Pertinent Vitals/Pain Pain Score: 2  Pain Location: R hip Pain  Descriptors / Indicators: Sore Pain Intervention(s): Monitored during session;Premedicated before session;Ice applied    Home Living                      Prior Function            PT Goals (current goals can now be found in the care plan section) Progress towards PT goals: Progressing toward goals    Frequency  7X/week    PT Plan Current plan remains appropriate    Co-evaluation             End of Session   Activity Tolerance: Patient tolerated treatment well Patient left: in chair;with call bell/phone within reach     Time: 1445-1520 PT Time Calculation (min) (ACUTE ONLY): 35 min  Charges:  $Gait Training: 8-22 mins $Therapeutic Exercise: 8-22 mins                    G Codes:      Claretha Cooper 06/11/2014, 5:32 PM

## 2014-06-11 NOTE — Progress Notes (Signed)
   Subjective: 3 Days Post-Op Procedure(s) (LRB): RIGHT TOTAL HIP ARTHROPLASTY ANTERIOR APPROACH (Right) Patient reports pain as mild.   Patient seen in rounds for Dr. Wynelle Link. Patient is well, but has had some minor complaints of pain in the thigh, requiring pain medications We will resume therapy today.  Plan is to go Home after hospital stay. Possibly tomorrow.  Objective: Vital signs in last 24 hours: Temp:  [98.3 F (36.8 C)-99.1 F (37.3 C)] 98.6 F (37 C) (01/23 0554) Pulse Rate:  [62-70] 70 (01/23 0554) Resp:  [16-18] 16 (01/23 0800) BP: (114-128)/(44-61) 119/45 mmHg (01/23 0554) SpO2:  [99 %-100 %] 100 % (01/23 0554)  Intake/Output from previous day:  Intake/Output Summary (Last 24 hours) at 06/11/14 1033 Last data filed at 06/11/14 0957  Gross per 24 hour  Intake    720 ml  Output   1002 ml  Net   -282 ml    Intake/Output this shift: Total I/O In: 240 [P.O.:240] Out: 2 [Urine:2]  Labs:  Recent Labs  06/09/14 0534 06/10/14 0535 06/11/14 0432  HGB 7.4* 10.8* 11.0*    Recent Labs  06/10/14 0535 06/11/14 0432  WBC 9.8 7.5  RBC 3.65* 3.67*  HCT 31.5* 31.8*  PLT 174 160    Recent Labs  06/09/14 0534 06/10/14 0535  NA 130* 134*  K 4.6 4.8  CL 98 100  CO2 24 27  BUN 21 20  CREATININE 0.88 0.77  GLUCOSE 139* 122*  CALCIUM 8.2* 8.6   No results for input(s): LABPT, INR in the last 72 hours.  EXAM General - Patient is Alert, Appropriate and Oriented Extremity - Neurovascular intact Sensation intact distally Dorsiflexion/Plantar flexion intact Dressing - dressing C/D/I, Incision looks good Motor Function - intact, moving foot and toes well on exam.   Past Medical History  Diagnosis Date  . Hypertension   . Arthritis   . Headache     due to hormone replacement    Assessment/Plan: 3 Days Post-Op Procedure(s) (LRB): RIGHT TOTAL HIP ARTHROPLASTY ANTERIOR APPROACH (Right) Principal Problem:   OA (osteoarthritis) of hip  Estimated  body mass index is 23.02 kg/(m^2) as calculated from the following:   Height as of this encounter: 5\' 7"  (1.702 m).   Weight as of this encounter: 66.679 kg (147 lb). Up with therapy Plan for discharge tomorrow Discharge home with home health  DVT Prophylaxis - Xarelto Weight Bearing As Tolerated right Leg   Arlee Muslim, PA-C Orthopaedic Surgery 06/11/2014, 10:33 AM

## 2014-06-11 NOTE — Progress Notes (Signed)
CARE MANAGEMENT NOTE 06/11/2014  Patient:  Veronica Maynard, Veronica Maynard   Account Number:  [id number]  Date Initiated:  06/09/2014  Documentation initiated by:  [facility]  Subjective/Objective Assessment:   adm: RIGHT TOTAL HIP ARTHROPLASTY ANTERIOR APPROACH (Right)     Action/Plan:   discharge planning   Anticipated DC Date:  06/11/2014   Anticipated DC Plan:  [place]  CM consult      [facility] Choice  HOME HEALTH   Choice offered to / List presented to:  C-1 Patient   DME arranged  NA      DME agency  NA     [place] arranged  HH-2 PT      [place]   Status of service:  Completed, signed off Medicare Important Message given?   (If response is "NO", the following Medicare IM given date fields will be blank) Date Medicare IM given:   Medicare IM given by:   Date Additional Medicare IM given:   Additional Medicare IM given by:    Discharge Disposition:  [place]  Per UR Regulation:    If discussed at Long Length of Stay Meetings, dates discussed:    Comments:  06/09/14 15:00 Cm met with pt in room to offer choice of home health agency. Pt chooses [facility] to render HHPT. address and contact information verified by pt.  No DME needed.  referral given to [employer], Tim.  No other CM needs were communicated. Mariane Masters, BSN, [place].

## 2014-06-12 NOTE — Progress Notes (Signed)
Discharge teaching completed with teach back. Discharge instructions reviewed and given to patient.  Prescription given for Norco, Robaxin, Tramadol, Rivaroxaban (handout given and reviewed on Rivaroxban. Pt. Understands to call MD office Monday to set up an appointment. Pt. has walker here and at home, raised potty chair, wheelchair at home and states no other equipment need.Pt. Denies complaints of pain. Spouse here to bring home.

## 2014-06-12 NOTE — Progress Notes (Signed)
Physical Therapy Treatment Patient Details Name: NIKIYAH FACKLER MRN: [id number] DOB: [birth year] Today's Date: 06/12/2014    History of Present Illness Pt is a 74 year old female s/p R direct anterior THA    PT Comments    POD # 4 eager to D/C to home today.  Assisted OOB to BR then amb in hallway.  Returned to room to perform THR TE's following handout HEP.  Instructed on proper tech and freq as well as use of ICE after.    Follow Up Recommendations  Home health PT     Equipment Recommendations  None recommended by PT    Recommendations for Other Services       Precautions / Restrictions Precautions Precautions: Fall Restrictions Weight Bearing Restrictions: No Other Position/Activity Restrictions: WBAT    Mobility  Bed Mobility Overal bed mobility: Needs Assistance Bed Mobility: Supine to Sit;Sit to Supine     Supine to sit: Supervision Sit to supine: Supervision   General bed mobility comments: increased time  Transfers Overall transfer level: Needs assistance Equipment used: Rolling walker (2 wheeled) Transfers: Sit to/from Stand Sit to Stand: Supervision;Modified independent (Device/Increase time)         General transfer comment: good safety cognition and use of hands  Ambulation/Gait Ambulation/Gait assistance: Modified independent (Device/Increase time);Supervision Ambulation Distance (Feet): 265 Feet Assistive device: Rolling walker (2 wheeled) Gait Pattern/deviations: Step-to pattern;Step-through pattern Gait velocity: decreased but functional   General Gait Details: pt prefers to lead off with "good" LE during gait.   Tolerated increased distance.  No c/o dizziness.    Stairs         General stair comments: pt performed stairs yesterday and pt did not feel need to repeat  Wheelchair Mobility    Modified Rankin (Stroke Patients Only)       Balance                                    Cognition                             Exercises   Total Hip Replacement TE's 10 reps ankle pumps 10 reps knee presses 10 reps heel slides 10 reps SAQ's 10 reps ABD Followed by ICE     General Comments        Pertinent Vitals/Pain Pain Assessment: 0-10 Pain Score: 6  Pain Location: R hip Pain Descriptors / Indicators: Sore;Tightness Pain Intervention(s): Monitored during session;RN gave pain meds during session;Repositioned;Ice applied    Home Living                      Prior Function            PT Goals (current goals can now be found in the care plan section) Progress towards PT goals: Progressing toward goals    Frequency  7X/week    PT Plan      Co-evaluation             End of Session Equipment Utilized During Treatment: Gait belt Activity Tolerance: Patient tolerated treatment well Patient left: in chair;with call bell/phone within reach     Time: 4401-0272 PT Time Calculation (min) (ACUTE ONLY): 29 min  Charges:  $Gait Training: 8-22 mins $Therapeutic Exercise: 8-22 mins  G Codes:      Rica Koyanagi  PTA [facility]  Acute  Rehab Pager      [phone]

## 2014-06-12 NOTE — Progress Notes (Signed)
Pt. Left via wheelchair escorted by NT and with spouse. No respiratory distress noted. Pt. Discharged to home.

## 2014-06-12 NOTE — Progress Notes (Signed)
   Subjective: 4 Days Post-Op Procedure(s) (LRB): RIGHT TOTAL HIP ARTHROPLASTY ANTERIOR APPROACH (Right) Patient reports pain as mild.   Patient seen in rounds with Dr. Wynelle Link. Patient is well, and has had no acute complaints or problems Patient is ready to go home today  Objective: Vital signs in last 24 hours: Temp:  [97.9 F (36.6 C)-98.2 F (36.8 C)] 97.9 F (36.6 C) (01/24 0524) Pulse Rate:  [75-88] 88 (01/24 0524) Resp:  [14-18] 18 (01/24 0524) BP: (113-148)/(60-69) 113/60 mmHg (01/24 0524) SpO2:  [92 %-100 %] 99 % (01/24 0524)  Intake/Output from previous day:  Intake/Output Summary (Last 24 hours) at 06/12/14 0714 Last data filed at 06/12/14 0525  Gross per 24 hour  Intake   1080 ml  Output      0 ml  Net   1080 ml    Labs:  Recent Labs  06/10/14 0535 06/11/14 0432  HGB 10.8* 11.0*    Recent Labs  06/10/14 0535 06/11/14 0432  WBC 9.8 7.5  RBC 3.65* 3.67*  HCT 31.5* 31.8*  PLT 174 160    Recent Labs  06/10/14 0535  NA 134*  K 4.8  CL 100  CO2 27  BUN 20  CREATININE 0.77  GLUCOSE 122*  CALCIUM 8.6   No results for input(s): LABPT, INR in the last 72 hours.  EXAM: General - Patient is Alert, Appropriate and Oriented Extremity - Neurovascular intact Sensation intact distally Dorsiflexion/Plantar flexion intact Incision - clean, dry, no drainage Motor Function - intact, moving foot and toes well on exam.   Assessment/Plan: 4 Days Post-Op Procedure(s) (LRB): RIGHT TOTAL HIP ARTHROPLASTY ANTERIOR APPROACH (Right) Procedure(s) (LRB): RIGHT TOTAL HIP ARTHROPLASTY ANTERIOR APPROACH (Right) Past Medical History  Diagnosis Date  . Hypertension   . Arthritis   . Headache     due to hormone replacement   Principal Problem:   OA (osteoarthritis) of hip  Estimated body mass index is 23.02 kg/(m^2) as calculated from the following:   Height as of this encounter: 5\' 7"  (1.702 m).   Weight as of this encounter: 66.679 kg (147 lb). Up with  therapy Discharge home with home health Diet - Cardiac diet Follow up - in 2 weeks Activity - WBAT Disposition - Home Condition Upon Discharge - Good D/C Meds - See DC Summary DVT Prophylaxis - Xarelto  Arlee Muslim, PA-C Orthopaedic Surgery 06/12/2014, 7:14 AM

## 2014-06-13 DIAGNOSIS — I4891 Unspecified atrial fibrillation: Secondary | ICD-10-CM | POA: Diagnosis not present

## 2014-06-13 DIAGNOSIS — Z471 Aftercare following joint replacement surgery: Secondary | ICD-10-CM | POA: Diagnosis not present

## 2014-06-13 DIAGNOSIS — I2510 Atherosclerotic heart disease of native coronary artery without angina pectoris: Secondary | ICD-10-CM | POA: Diagnosis not present

## 2014-06-13 DIAGNOSIS — I10 Essential (primary) hypertension: Secondary | ICD-10-CM | POA: Diagnosis not present

## 2014-06-13 DIAGNOSIS — Z96641 Presence of right artificial hip joint: Secondary | ICD-10-CM | POA: Diagnosis not present

## 2014-06-13 DIAGNOSIS — Z7901 Long term (current) use of anticoagulants: Secondary | ICD-10-CM | POA: Diagnosis not present

## 2014-06-15 DIAGNOSIS — I4891 Unspecified atrial fibrillation: Secondary | ICD-10-CM | POA: Diagnosis not present

## 2014-06-15 DIAGNOSIS — Z471 Aftercare following joint replacement surgery: Secondary | ICD-10-CM | POA: Diagnosis not present

## 2014-06-15 DIAGNOSIS — Z7901 Long term (current) use of anticoagulants: Secondary | ICD-10-CM | POA: Diagnosis not present

## 2014-06-15 DIAGNOSIS — I2510 Atherosclerotic heart disease of native coronary artery without angina pectoris: Secondary | ICD-10-CM | POA: Diagnosis not present

## 2014-06-15 DIAGNOSIS — Z96641 Presence of right artificial hip joint: Secondary | ICD-10-CM | POA: Diagnosis not present

## 2014-06-15 DIAGNOSIS — I10 Essential (primary) hypertension: Secondary | ICD-10-CM | POA: Diagnosis not present

## 2014-06-17 DIAGNOSIS — I10 Essential (primary) hypertension: Secondary | ICD-10-CM | POA: Diagnosis not present

## 2014-06-17 DIAGNOSIS — I4891 Unspecified atrial fibrillation: Secondary | ICD-10-CM | POA: Diagnosis not present

## 2014-06-17 DIAGNOSIS — I2510 Atherosclerotic heart disease of native coronary artery without angina pectoris: Secondary | ICD-10-CM | POA: Diagnosis not present

## 2014-06-17 DIAGNOSIS — Z471 Aftercare following joint replacement surgery: Secondary | ICD-10-CM | POA: Diagnosis not present

## 2014-06-17 DIAGNOSIS — Z96641 Presence of right artificial hip joint: Secondary | ICD-10-CM | POA: Diagnosis not present

## 2014-06-17 DIAGNOSIS — Z7901 Long term (current) use of anticoagulants: Secondary | ICD-10-CM | POA: Diagnosis not present

## 2014-06-20 DIAGNOSIS — I2510 Atherosclerotic heart disease of native coronary artery without angina pectoris: Secondary | ICD-10-CM | POA: Diagnosis not present

## 2014-06-20 DIAGNOSIS — Z96641 Presence of right artificial hip joint: Secondary | ICD-10-CM | POA: Diagnosis not present

## 2014-06-20 DIAGNOSIS — Z471 Aftercare following joint replacement surgery: Secondary | ICD-10-CM | POA: Diagnosis not present

## 2014-06-20 DIAGNOSIS — Z7901 Long term (current) use of anticoagulants: Secondary | ICD-10-CM | POA: Diagnosis not present

## 2014-06-20 DIAGNOSIS — I10 Essential (primary) hypertension: Secondary | ICD-10-CM | POA: Diagnosis not present

## 2014-06-20 DIAGNOSIS — I4891 Unspecified atrial fibrillation: Secondary | ICD-10-CM | POA: Diagnosis not present

## 2014-06-22 DIAGNOSIS — I10 Essential (primary) hypertension: Secondary | ICD-10-CM | POA: Diagnosis not present

## 2014-06-22 DIAGNOSIS — Z471 Aftercare following joint replacement surgery: Secondary | ICD-10-CM | POA: Diagnosis not present

## 2014-06-22 DIAGNOSIS — Z7901 Long term (current) use of anticoagulants: Secondary | ICD-10-CM | POA: Diagnosis not present

## 2014-06-22 DIAGNOSIS — I4891 Unspecified atrial fibrillation: Secondary | ICD-10-CM | POA: Diagnosis not present

## 2014-06-22 DIAGNOSIS — I2510 Atherosclerotic heart disease of native coronary artery without angina pectoris: Secondary | ICD-10-CM | POA: Diagnosis not present

## 2014-06-22 DIAGNOSIS — Z96641 Presence of right artificial hip joint: Secondary | ICD-10-CM | POA: Diagnosis not present

## 2014-06-23 DIAGNOSIS — Z96641 Presence of right artificial hip joint: Secondary | ICD-10-CM | POA: Diagnosis not present

## 2014-06-23 DIAGNOSIS — Z471 Aftercare following joint replacement surgery: Secondary | ICD-10-CM | POA: Diagnosis not present

## 2014-06-24 DIAGNOSIS — I10 Essential (primary) hypertension: Secondary | ICD-10-CM | POA: Diagnosis not present

## 2014-06-24 DIAGNOSIS — Z7901 Long term (current) use of anticoagulants: Secondary | ICD-10-CM | POA: Diagnosis not present

## 2014-06-24 DIAGNOSIS — I4891 Unspecified atrial fibrillation: Secondary | ICD-10-CM | POA: Diagnosis not present

## 2014-06-24 DIAGNOSIS — Z471 Aftercare following joint replacement surgery: Secondary | ICD-10-CM | POA: Diagnosis not present

## 2014-06-24 DIAGNOSIS — Z96641 Presence of right artificial hip joint: Secondary | ICD-10-CM | POA: Diagnosis not present

## 2014-06-24 DIAGNOSIS — I2510 Atherosclerotic heart disease of native coronary artery without angina pectoris: Secondary | ICD-10-CM | POA: Diagnosis not present

## 2014-06-27 DIAGNOSIS — M25551 Pain in right hip: Secondary | ICD-10-CM | POA: Diagnosis not present

## 2014-06-27 DIAGNOSIS — M62551 Muscle wasting and atrophy, not elsewhere classified, right thigh: Secondary | ICD-10-CM | POA: Diagnosis not present

## 2014-06-27 DIAGNOSIS — M79604 Pain in right leg: Secondary | ICD-10-CM | POA: Diagnosis not present

## 2014-06-27 DIAGNOSIS — Z96641 Presence of right artificial hip joint: Secondary | ICD-10-CM | POA: Diagnosis not present

## 2014-07-01 DIAGNOSIS — M25551 Pain in right hip: Secondary | ICD-10-CM | POA: Diagnosis not present

## 2014-07-01 DIAGNOSIS — M79604 Pain in right leg: Secondary | ICD-10-CM | POA: Diagnosis not present

## 2014-07-01 DIAGNOSIS — M62551 Muscle wasting and atrophy, not elsewhere classified, right thigh: Secondary | ICD-10-CM | POA: Diagnosis not present

## 2014-07-01 DIAGNOSIS — Z96641 Presence of right artificial hip joint: Secondary | ICD-10-CM | POA: Diagnosis not present

## 2014-07-06 DIAGNOSIS — Z96641 Presence of right artificial hip joint: Secondary | ICD-10-CM | POA: Diagnosis not present

## 2014-07-06 DIAGNOSIS — M25551 Pain in right hip: Secondary | ICD-10-CM | POA: Diagnosis not present

## 2014-07-06 DIAGNOSIS — M79604 Pain in right leg: Secondary | ICD-10-CM | POA: Diagnosis not present

## 2014-07-06 DIAGNOSIS — M62551 Muscle wasting and atrophy, not elsewhere classified, right thigh: Secondary | ICD-10-CM | POA: Diagnosis not present

## 2014-07-08 DIAGNOSIS — M79604 Pain in right leg: Secondary | ICD-10-CM | POA: Diagnosis not present

## 2014-07-08 DIAGNOSIS — Z96641 Presence of right artificial hip joint: Secondary | ICD-10-CM | POA: Diagnosis not present

## 2014-07-08 DIAGNOSIS — M62551 Muscle wasting and atrophy, not elsewhere classified, right thigh: Secondary | ICD-10-CM | POA: Diagnosis not present

## 2014-07-08 DIAGNOSIS — M25551 Pain in right hip: Secondary | ICD-10-CM | POA: Diagnosis not present

## 2014-07-12 DIAGNOSIS — Z96641 Presence of right artificial hip joint: Secondary | ICD-10-CM | POA: Diagnosis not present

## 2014-07-12 DIAGNOSIS — Z471 Aftercare following joint replacement surgery: Secondary | ICD-10-CM | POA: Diagnosis not present

## 2014-07-13 DIAGNOSIS — M79604 Pain in right leg: Secondary | ICD-10-CM | POA: Diagnosis not present

## 2014-07-13 DIAGNOSIS — Z9181 History of falling: Secondary | ICD-10-CM | POA: Diagnosis not present

## 2014-07-13 DIAGNOSIS — Z792 Long term (current) use of antibiotics: Secondary | ICD-10-CM | POA: Diagnosis not present

## 2014-07-13 DIAGNOSIS — M25551 Pain in right hip: Secondary | ICD-10-CM | POA: Diagnosis not present

## 2014-07-13 DIAGNOSIS — I10 Essential (primary) hypertension: Secondary | ICD-10-CM | POA: Diagnosis not present

## 2014-07-13 DIAGNOSIS — Z96641 Presence of right artificial hip joint: Secondary | ICD-10-CM | POA: Diagnosis not present

## 2014-07-13 DIAGNOSIS — Z6822 Body mass index (BMI) 22.0-22.9, adult: Secondary | ICD-10-CM | POA: Diagnosis not present

## 2014-07-13 DIAGNOSIS — I471 Supraventricular tachycardia: Secondary | ICD-10-CM | POA: Diagnosis not present

## 2014-07-13 DIAGNOSIS — Z1389 Encounter for screening for other disorder: Secondary | ICD-10-CM | POA: Diagnosis not present

## 2014-07-13 DIAGNOSIS — D500 Iron deficiency anemia secondary to blood loss (chronic): Secondary | ICD-10-CM | POA: Diagnosis not present

## 2014-07-13 DIAGNOSIS — M62551 Muscle wasting and atrophy, not elsewhere classified, right thigh: Secondary | ICD-10-CM | POA: Diagnosis not present

## 2014-07-13 DIAGNOSIS — E785 Hyperlipidemia, unspecified: Secondary | ICD-10-CM | POA: Diagnosis not present

## 2014-07-15 DIAGNOSIS — M62551 Muscle wasting and atrophy, not elsewhere classified, right thigh: Secondary | ICD-10-CM | POA: Diagnosis not present

## 2014-07-15 DIAGNOSIS — Z96641 Presence of right artificial hip joint: Secondary | ICD-10-CM | POA: Diagnosis not present

## 2014-07-15 DIAGNOSIS — M25551 Pain in right hip: Secondary | ICD-10-CM | POA: Diagnosis not present

## 2014-07-15 DIAGNOSIS — M79604 Pain in right leg: Secondary | ICD-10-CM | POA: Diagnosis not present

## 2014-07-20 DIAGNOSIS — M62551 Muscle wasting and atrophy, not elsewhere classified, right thigh: Secondary | ICD-10-CM | POA: Diagnosis not present

## 2014-07-20 DIAGNOSIS — M25551 Pain in right hip: Secondary | ICD-10-CM | POA: Diagnosis not present

## 2014-07-20 DIAGNOSIS — Z96641 Presence of right artificial hip joint: Secondary | ICD-10-CM | POA: Diagnosis not present

## 2014-07-20 DIAGNOSIS — M79604 Pain in right leg: Secondary | ICD-10-CM | POA: Diagnosis not present

## 2014-07-22 DIAGNOSIS — M79604 Pain in right leg: Secondary | ICD-10-CM | POA: Diagnosis not present

## 2014-07-22 DIAGNOSIS — M62551 Muscle wasting and atrophy, not elsewhere classified, right thigh: Secondary | ICD-10-CM | POA: Diagnosis not present

## 2014-07-22 DIAGNOSIS — M25551 Pain in right hip: Secondary | ICD-10-CM | POA: Diagnosis not present

## 2014-07-22 DIAGNOSIS — Z96641 Presence of right artificial hip joint: Secondary | ICD-10-CM | POA: Diagnosis not present

## 2014-08-23 DIAGNOSIS — Z471 Aftercare following joint replacement surgery: Secondary | ICD-10-CM | POA: Diagnosis not present

## 2014-08-23 DIAGNOSIS — Z96641 Presence of right artificial hip joint: Secondary | ICD-10-CM | POA: Diagnosis not present

## 2014-10-14 DIAGNOSIS — H43813 Vitreous degeneration, bilateral: Secondary | ICD-10-CM | POA: Diagnosis not present

## 2014-10-14 DIAGNOSIS — Z961 Presence of intraocular lens: Secondary | ICD-10-CM | POA: Diagnosis not present

## 2014-10-14 DIAGNOSIS — Z9841 Cataract extraction status, right eye: Secondary | ICD-10-CM | POA: Diagnosis not present

## 2014-10-14 DIAGNOSIS — H52221 Regular astigmatism, right eye: Secondary | ICD-10-CM | POA: Diagnosis not present

## 2014-10-14 DIAGNOSIS — H35371 Puckering of macula, right eye: Secondary | ICD-10-CM | POA: Diagnosis not present

## 2014-10-14 DIAGNOSIS — Z9842 Cataract extraction status, left eye: Secondary | ICD-10-CM | POA: Diagnosis not present

## 2014-10-14 DIAGNOSIS — H35373 Puckering of macula, bilateral: Secondary | ICD-10-CM | POA: Diagnosis not present

## 2014-10-14 DIAGNOSIS — H26492 Other secondary cataract, left eye: Secondary | ICD-10-CM | POA: Diagnosis not present

## 2014-10-14 DIAGNOSIS — H35351 Cystoid macular degeneration, right eye: Secondary | ICD-10-CM | POA: Diagnosis not present

## 2014-10-14 DIAGNOSIS — H35341 Macular cyst, hole, or pseudohole, right eye: Secondary | ICD-10-CM | POA: Diagnosis not present

## 2014-10-14 DIAGNOSIS — H5201 Hypermetropia, right eye: Secondary | ICD-10-CM | POA: Diagnosis not present

## 2014-10-14 DIAGNOSIS — H43313 Vitreous membranes and strands, bilateral: Secondary | ICD-10-CM | POA: Diagnosis not present

## 2014-11-14 DIAGNOSIS — M8580 Other specified disorders of bone density and structure, unspecified site: Secondary | ICD-10-CM | POA: Diagnosis not present

## 2014-11-14 DIAGNOSIS — Z1389 Encounter for screening for other disorder: Secondary | ICD-10-CM | POA: Diagnosis not present

## 2014-11-14 DIAGNOSIS — I10 Essential (primary) hypertension: Secondary | ICD-10-CM | POA: Diagnosis not present

## 2014-11-14 DIAGNOSIS — Z6822 Body mass index (BMI) 22.0-22.9, adult: Secondary | ICD-10-CM | POA: Diagnosis not present

## 2014-11-14 DIAGNOSIS — E785 Hyperlipidemia, unspecified: Secondary | ICD-10-CM | POA: Diagnosis not present

## 2014-11-14 DIAGNOSIS — Z1231 Encounter for screening mammogram for malignant neoplasm of breast: Secondary | ICD-10-CM | POA: Diagnosis not present

## 2014-12-05 DIAGNOSIS — Z1231 Encounter for screening mammogram for malignant neoplasm of breast: Secondary | ICD-10-CM | POA: Diagnosis not present

## 2014-12-05 DIAGNOSIS — M8589 Other specified disorders of bone density and structure, multiple sites: Secondary | ICD-10-CM | POA: Diagnosis not present

## 2015-02-28 DIAGNOSIS — Z23 Encounter for immunization: Secondary | ICD-10-CM | POA: Diagnosis not present

## 2015-03-20 DIAGNOSIS — E785 Hyperlipidemia, unspecified: Secondary | ICD-10-CM | POA: Diagnosis not present

## 2015-03-20 DIAGNOSIS — I10 Essential (primary) hypertension: Secondary | ICD-10-CM | POA: Diagnosis not present

## 2015-03-20 DIAGNOSIS — I471 Supraventricular tachycardia: Secondary | ICD-10-CM | POA: Diagnosis not present

## 2015-03-20 DIAGNOSIS — E871 Hypo-osmolality and hyponatremia: Secondary | ICD-10-CM | POA: Diagnosis not present

## 2015-03-20 DIAGNOSIS — R209 Unspecified disturbances of skin sensation: Secondary | ICD-10-CM | POA: Diagnosis not present

## 2015-06-15 DIAGNOSIS — Z471 Aftercare following joint replacement surgery: Secondary | ICD-10-CM | POA: Diagnosis not present

## 2015-06-15 DIAGNOSIS — Z96641 Presence of right artificial hip joint: Secondary | ICD-10-CM | POA: Diagnosis not present

## 2015-07-24 DIAGNOSIS — I471 Supraventricular tachycardia: Secondary | ICD-10-CM | POA: Diagnosis not present

## 2015-07-24 DIAGNOSIS — I10 Essential (primary) hypertension: Secondary | ICD-10-CM | POA: Diagnosis not present

## 2015-07-24 DIAGNOSIS — E559 Vitamin D deficiency, unspecified: Secondary | ICD-10-CM | POA: Diagnosis not present

## 2015-07-24 DIAGNOSIS — E785 Hyperlipidemia, unspecified: Secondary | ICD-10-CM | POA: Diagnosis not present

## 2015-07-24 DIAGNOSIS — E538 Deficiency of other specified B group vitamins: Secondary | ICD-10-CM | POA: Diagnosis not present

## 2015-07-24 DIAGNOSIS — Z6823 Body mass index (BMI) 23.0-23.9, adult: Secondary | ICD-10-CM | POA: Diagnosis not present

## 2015-07-24 DIAGNOSIS — D539 Nutritional anemia, unspecified: Secondary | ICD-10-CM | POA: Diagnosis not present

## 2015-08-05 IMAGING — DX DG PORTABLE PELVIS
1 series · 1 of 1 positions shown · non-contrast
Comparison: None.

CLINICAL DATA: 73-year-old female status post right hip surgery.
Initial encounter.

EXAM:
PORTABLE PELVIS 1-2 VIEWS

[pelvis ap]
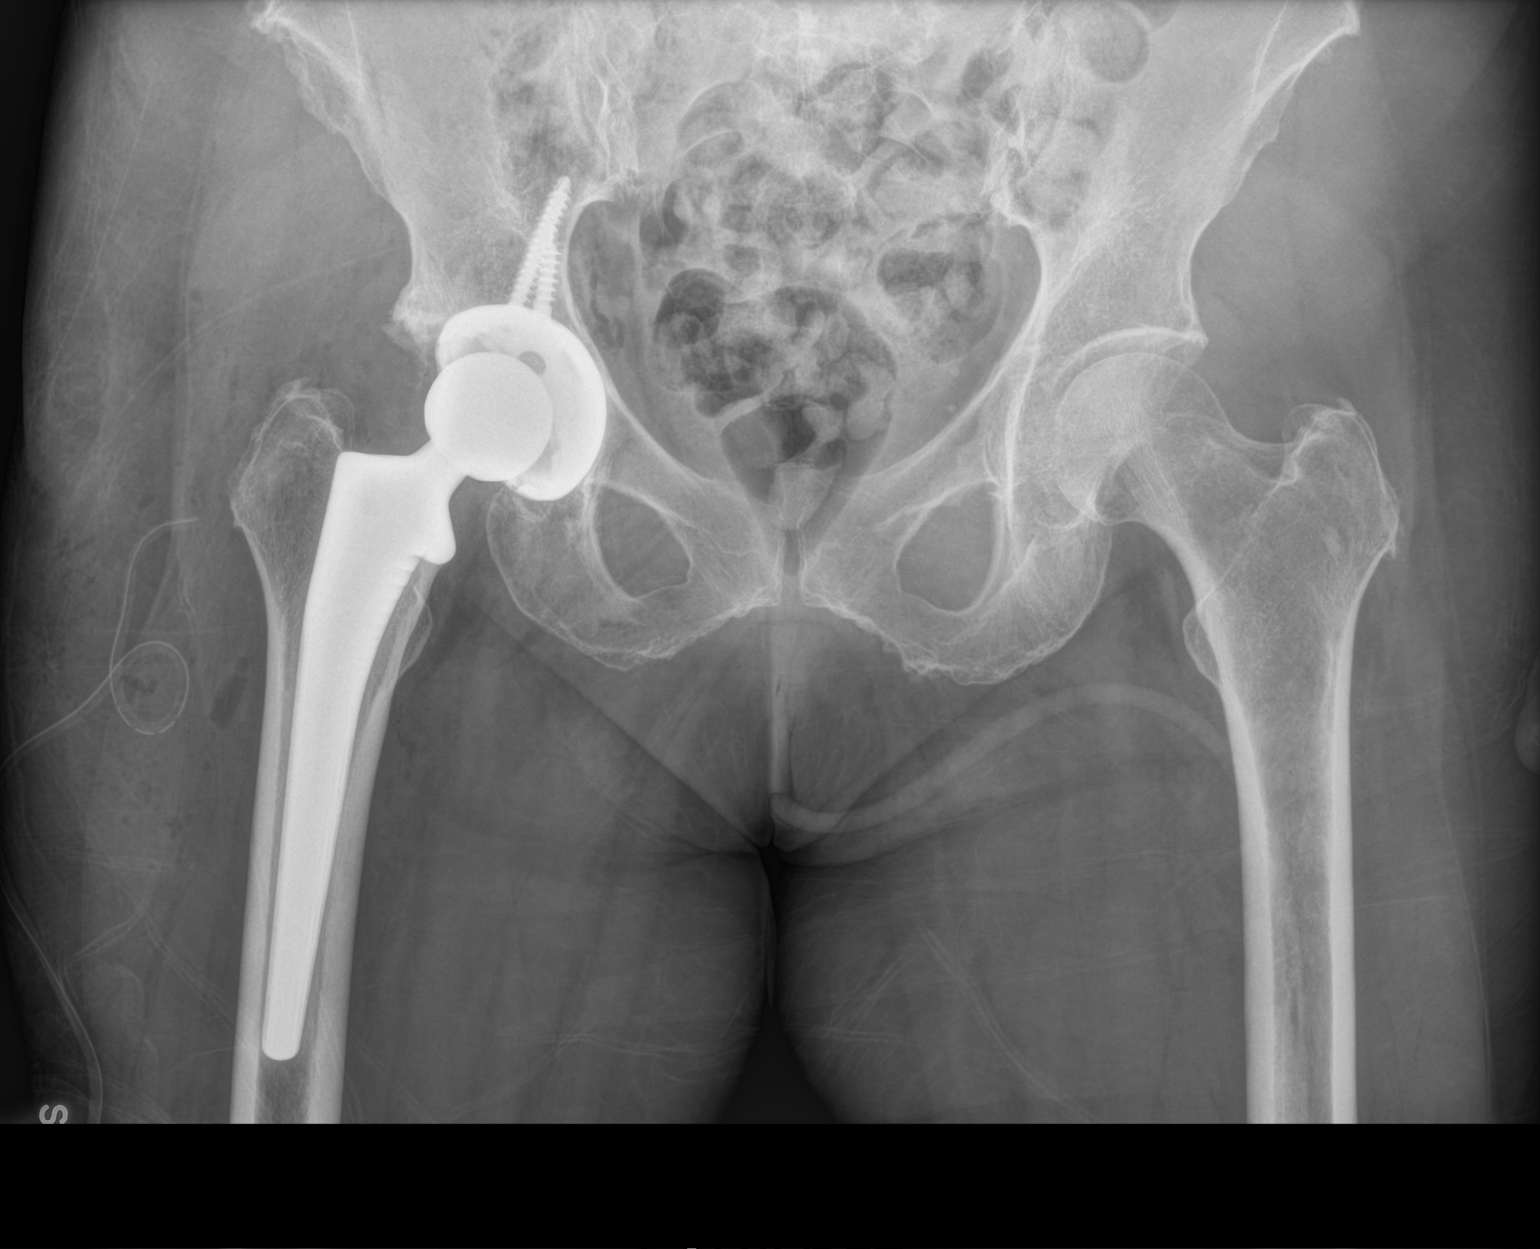

[1 of 1 positions shown; findings below may reference images not displayed]

FINDINGS: Portable AP view of the pelvis at 1118 hrs. Right total hip
arthroplasty hardware in place and appears normally aligned. Nearby
postoperative drain, and surrounding postoperative changes to the
soft tissues. No other acute osseous abnormality identified.
IMPRESSION: Right total hip arthroplasty with no adverse features.

## 2015-11-29 DIAGNOSIS — I10 Essential (primary) hypertension: Secondary | ICD-10-CM | POA: Diagnosis not present

## 2015-11-29 DIAGNOSIS — L309 Dermatitis, unspecified: Secondary | ICD-10-CM | POA: Diagnosis not present

## 2015-11-29 DIAGNOSIS — I471 Supraventricular tachycardia: Secondary | ICD-10-CM | POA: Diagnosis not present

## 2015-11-29 DIAGNOSIS — E785 Hyperlipidemia, unspecified: Secondary | ICD-10-CM | POA: Diagnosis not present

## 2015-11-29 DIAGNOSIS — Z6823 Body mass index (BMI) 23.0-23.9, adult: Secondary | ICD-10-CM | POA: Diagnosis not present

## 2015-11-29 DIAGNOSIS — Z1389 Encounter for screening for other disorder: Secondary | ICD-10-CM | POA: Diagnosis not present

## 2015-11-29 DIAGNOSIS — Z9181 History of falling: Secondary | ICD-10-CM | POA: Diagnosis not present

## 2015-11-29 DIAGNOSIS — Z1231 Encounter for screening mammogram for malignant neoplasm of breast: Secondary | ICD-10-CM | POA: Diagnosis not present

## 2015-12-07 DIAGNOSIS — Z1231 Encounter for screening mammogram for malignant neoplasm of breast: Secondary | ICD-10-CM | POA: Diagnosis not present

## 2016-01-03 DIAGNOSIS — H02052 Trichiasis without entropian right lower eyelid: Secondary | ICD-10-CM | POA: Diagnosis not present

## 2016-01-03 DIAGNOSIS — H01024 Squamous blepharitis left upper eyelid: Secondary | ICD-10-CM | POA: Diagnosis not present

## 2016-01-03 DIAGNOSIS — H02055 Trichiasis without entropian left lower eyelid: Secondary | ICD-10-CM | POA: Diagnosis not present

## 2016-01-03 DIAGNOSIS — H01025 Squamous blepharitis left lower eyelid: Secondary | ICD-10-CM | POA: Diagnosis not present

## 2016-01-03 DIAGNOSIS — H26492 Other secondary cataract, left eye: Secondary | ICD-10-CM | POA: Diagnosis not present

## 2016-01-03 DIAGNOSIS — Z961 Presence of intraocular lens: Secondary | ICD-10-CM | POA: Diagnosis not present

## 2016-01-03 DIAGNOSIS — H01021 Squamous blepharitis right upper eyelid: Secondary | ICD-10-CM | POA: Diagnosis not present

## 2016-01-03 DIAGNOSIS — H01022 Squamous blepharitis right lower eyelid: Secondary | ICD-10-CM | POA: Diagnosis not present

## 2016-02-19 DIAGNOSIS — M545 Low back pain: Secondary | ICD-10-CM | POA: Diagnosis not present

## 2016-02-19 DIAGNOSIS — M5136 Other intervertebral disc degeneration, lumbar region: Secondary | ICD-10-CM | POA: Diagnosis not present

## 2016-03-04 DIAGNOSIS — Z23 Encounter for immunization: Secondary | ICD-10-CM | POA: Diagnosis not present

## 2016-03-05 DIAGNOSIS — M5116 Intervertebral disc disorders with radiculopathy, lumbar region: Secondary | ICD-10-CM | POA: Diagnosis not present

## 2016-03-05 DIAGNOSIS — M6281 Muscle weakness (generalized): Secondary | ICD-10-CM | POA: Diagnosis not present

## 2016-03-07 DIAGNOSIS — M5116 Intervertebral disc disorders with radiculopathy, lumbar region: Secondary | ICD-10-CM | POA: Diagnosis not present

## 2016-03-07 DIAGNOSIS — M6281 Muscle weakness (generalized): Secondary | ICD-10-CM | POA: Diagnosis not present

## 2016-03-12 DIAGNOSIS — M5116 Intervertebral disc disorders with radiculopathy, lumbar region: Secondary | ICD-10-CM | POA: Diagnosis not present

## 2016-03-12 DIAGNOSIS — M6281 Muscle weakness (generalized): Secondary | ICD-10-CM | POA: Diagnosis not present

## 2016-03-14 DIAGNOSIS — M6281 Muscle weakness (generalized): Secondary | ICD-10-CM | POA: Diagnosis not present

## 2016-03-14 DIAGNOSIS — M5116 Intervertebral disc disorders with radiculopathy, lumbar region: Secondary | ICD-10-CM | POA: Diagnosis not present

## 2016-03-19 DIAGNOSIS — M5116 Intervertebral disc disorders with radiculopathy, lumbar region: Secondary | ICD-10-CM | POA: Diagnosis not present

## 2016-03-19 DIAGNOSIS — M6281 Muscle weakness (generalized): Secondary | ICD-10-CM | POA: Diagnosis not present

## 2016-03-21 DIAGNOSIS — M5116 Intervertebral disc disorders with radiculopathy, lumbar region: Secondary | ICD-10-CM | POA: Diagnosis not present

## 2016-03-21 DIAGNOSIS — M6281 Muscle weakness (generalized): Secondary | ICD-10-CM | POA: Diagnosis not present

## 2016-03-26 DIAGNOSIS — M5116 Intervertebral disc disorders with radiculopathy, lumbar region: Secondary | ICD-10-CM | POA: Diagnosis not present

## 2016-03-26 DIAGNOSIS — M6281 Muscle weakness (generalized): Secondary | ICD-10-CM | POA: Diagnosis not present

## 2016-03-28 DIAGNOSIS — M5116 Intervertebral disc disorders with radiculopathy, lumbar region: Secondary | ICD-10-CM | POA: Diagnosis not present

## 2016-03-28 DIAGNOSIS — M6281 Muscle weakness (generalized): Secondary | ICD-10-CM | POA: Diagnosis not present

## 2016-04-02 DIAGNOSIS — E785 Hyperlipidemia, unspecified: Secondary | ICD-10-CM | POA: Diagnosis not present

## 2016-04-02 DIAGNOSIS — E559 Vitamin D deficiency, unspecified: Secondary | ICD-10-CM | POA: Diagnosis not present

## 2016-04-02 DIAGNOSIS — D539 Nutritional anemia, unspecified: Secondary | ICD-10-CM | POA: Diagnosis not present

## 2016-04-02 DIAGNOSIS — I10 Essential (primary) hypertension: Secondary | ICD-10-CM | POA: Diagnosis not present

## 2016-04-02 DIAGNOSIS — M8580 Other specified disorders of bone density and structure, unspecified site: Secondary | ICD-10-CM | POA: Diagnosis not present

## 2016-04-02 DIAGNOSIS — M5116 Intervertebral disc disorders with radiculopathy, lumbar region: Secondary | ICD-10-CM | POA: Diagnosis not present

## 2016-04-02 DIAGNOSIS — M6281 Muscle weakness (generalized): Secondary | ICD-10-CM | POA: Diagnosis not present

## 2016-04-09 DIAGNOSIS — M6281 Muscle weakness (generalized): Secondary | ICD-10-CM | POA: Diagnosis not present

## 2016-04-09 DIAGNOSIS — M5116 Intervertebral disc disorders with radiculopathy, lumbar region: Secondary | ICD-10-CM | POA: Diagnosis not present

## 2016-04-18 DIAGNOSIS — M5116 Intervertebral disc disorders with radiculopathy, lumbar region: Secondary | ICD-10-CM | POA: Diagnosis not present

## 2016-04-18 DIAGNOSIS — M6281 Muscle weakness (generalized): Secondary | ICD-10-CM | POA: Diagnosis not present

## 2016-04-25 DIAGNOSIS — M6281 Muscle weakness (generalized): Secondary | ICD-10-CM | POA: Diagnosis not present

## 2016-04-25 DIAGNOSIS — M5116 Intervertebral disc disorders with radiculopathy, lumbar region: Secondary | ICD-10-CM | POA: Diagnosis not present

## 2016-05-03 DIAGNOSIS — D539 Nutritional anemia, unspecified: Secondary | ICD-10-CM | POA: Diagnosis not present

## 2016-05-14 DIAGNOSIS — Z1212 Encounter for screening for malignant neoplasm of rectum: Secondary | ICD-10-CM | POA: Diagnosis not present

## 2016-06-19 DIAGNOSIS — I471 Supraventricular tachycardia: Secondary | ICD-10-CM | POA: Diagnosis not present

## 2016-06-19 DIAGNOSIS — I10 Essential (primary) hypertension: Secondary | ICD-10-CM | POA: Diagnosis not present

## 2016-06-19 DIAGNOSIS — J029 Acute pharyngitis, unspecified: Secondary | ICD-10-CM | POA: Diagnosis not present

## 2016-06-19 DIAGNOSIS — D539 Nutritional anemia, unspecified: Secondary | ICD-10-CM | POA: Diagnosis not present

## 2016-07-03 DIAGNOSIS — R002 Palpitations: Secondary | ICD-10-CM | POA: Diagnosis not present

## 2016-07-03 DIAGNOSIS — J069 Acute upper respiratory infection, unspecified: Secondary | ICD-10-CM | POA: Diagnosis not present

## 2016-07-03 DIAGNOSIS — I10 Essential (primary) hypertension: Secondary | ICD-10-CM | POA: Diagnosis not present

## 2016-07-04 DIAGNOSIS — I10 Essential (primary) hypertension: Secondary | ICD-10-CM

## 2016-07-04 DIAGNOSIS — E781 Pure hyperglyceridemia: Secondary | ICD-10-CM | POA: Insufficient documentation

## 2016-07-04 DIAGNOSIS — R002 Palpitations: Secondary | ICD-10-CM | POA: Insufficient documentation

## 2016-07-04 HISTORY — DX: Palpitations: R00.2

## 2016-07-04 HISTORY — DX: Pure hyperglyceridemia: E78.1

## 2016-07-04 HISTORY — DX: Essential (primary) hypertension: I10

## 2016-07-11 DIAGNOSIS — I361 Nonrheumatic tricuspid (valve) insufficiency: Secondary | ICD-10-CM | POA: Diagnosis not present

## 2016-07-15 DIAGNOSIS — R002 Palpitations: Secondary | ICD-10-CM | POA: Diagnosis not present

## 2016-07-16 DIAGNOSIS — R002 Palpitations: Secondary | ICD-10-CM | POA: Diagnosis not present

## 2016-07-16 DIAGNOSIS — E781 Pure hyperglyceridemia: Secondary | ICD-10-CM | POA: Diagnosis not present

## 2016-07-16 DIAGNOSIS — I10 Essential (primary) hypertension: Secondary | ICD-10-CM | POA: Diagnosis not present

## 2016-07-18 DIAGNOSIS — Z0000 Encounter for general adult medical examination without abnormal findings: Secondary | ICD-10-CM | POA: Diagnosis not present

## 2016-07-18 DIAGNOSIS — Z136 Encounter for screening for cardiovascular disorders: Secondary | ICD-10-CM | POA: Diagnosis not present

## 2016-07-18 DIAGNOSIS — N959 Unspecified menopausal and perimenopausal disorder: Secondary | ICD-10-CM | POA: Diagnosis not present

## 2016-07-18 DIAGNOSIS — E785 Hyperlipidemia, unspecified: Secondary | ICD-10-CM | POA: Diagnosis not present

## 2016-07-18 DIAGNOSIS — Z1389 Encounter for screening for other disorder: Secondary | ICD-10-CM | POA: Diagnosis not present

## 2016-07-18 DIAGNOSIS — Z1211 Encounter for screening for malignant neoplasm of colon: Secondary | ICD-10-CM | POA: Diagnosis not present

## 2016-07-18 DIAGNOSIS — Z1231 Encounter for screening mammogram for malignant neoplasm of breast: Secondary | ICD-10-CM | POA: Diagnosis not present

## 2016-07-18 DIAGNOSIS — Z9181 History of falling: Secondary | ICD-10-CM | POA: Diagnosis not present

## 2016-08-08 DIAGNOSIS — E781 Pure hyperglyceridemia: Secondary | ICD-10-CM | POA: Diagnosis not present

## 2016-08-08 DIAGNOSIS — I10 Essential (primary) hypertension: Secondary | ICD-10-CM | POA: Diagnosis not present

## 2016-08-08 DIAGNOSIS — R002 Palpitations: Secondary | ICD-10-CM | POA: Diagnosis not present

## 2016-08-09 DIAGNOSIS — L570 Actinic keratosis: Secondary | ICD-10-CM | POA: Diagnosis not present

## 2016-08-09 DIAGNOSIS — L821 Other seborrheic keratosis: Secondary | ICD-10-CM | POA: Diagnosis not present

## 2016-08-09 DIAGNOSIS — L578 Other skin changes due to chronic exposure to nonionizing radiation: Secondary | ICD-10-CM | POA: Diagnosis not present

## 2016-08-09 DIAGNOSIS — L601 Onycholysis: Secondary | ICD-10-CM | POA: Diagnosis not present

## 2016-08-09 DIAGNOSIS — L301 Dyshidrosis [pompholyx]: Secondary | ICD-10-CM | POA: Diagnosis not present

## 2016-09-06 DIAGNOSIS — E785 Hyperlipidemia, unspecified: Secondary | ICD-10-CM | POA: Diagnosis not present

## 2016-09-06 DIAGNOSIS — D539 Nutritional anemia, unspecified: Secondary | ICD-10-CM | POA: Diagnosis not present

## 2016-09-06 DIAGNOSIS — I10 Essential (primary) hypertension: Secondary | ICD-10-CM | POA: Diagnosis not present

## 2016-09-06 DIAGNOSIS — I471 Supraventricular tachycardia: Secondary | ICD-10-CM | POA: Diagnosis not present

## 2016-10-15 DIAGNOSIS — B079 Viral wart, unspecified: Secondary | ICD-10-CM | POA: Diagnosis not present

## 2016-10-15 DIAGNOSIS — L570 Actinic keratosis: Secondary | ICD-10-CM | POA: Diagnosis not present

## 2016-10-15 DIAGNOSIS — L821 Other seborrheic keratosis: Secondary | ICD-10-CM | POA: Diagnosis not present

## 2016-10-15 DIAGNOSIS — L578 Other skin changes due to chronic exposure to nonionizing radiation: Secondary | ICD-10-CM | POA: Diagnosis not present

## 2016-10-15 DIAGNOSIS — L301 Dyshidrosis [pompholyx]: Secondary | ICD-10-CM | POA: Diagnosis not present

## 2016-11-05 DIAGNOSIS — S81801A Unspecified open wound, right lower leg, initial encounter: Secondary | ICD-10-CM | POA: Diagnosis not present

## 2016-11-06 DIAGNOSIS — S81801A Unspecified open wound, right lower leg, initial encounter: Secondary | ICD-10-CM | POA: Diagnosis not present

## 2016-11-06 DIAGNOSIS — Z6823 Body mass index (BMI) 23.0-23.9, adult: Secondary | ICD-10-CM | POA: Diagnosis not present

## 2016-11-08 DIAGNOSIS — S81801A Unspecified open wound, right lower leg, initial encounter: Secondary | ICD-10-CM | POA: Diagnosis not present

## 2016-11-14 DIAGNOSIS — S81801D Unspecified open wound, right lower leg, subsequent encounter: Secondary | ICD-10-CM | POA: Diagnosis not present

## 2016-11-14 DIAGNOSIS — I10 Essential (primary) hypertension: Secondary | ICD-10-CM | POA: Diagnosis not present

## 2016-12-11 DIAGNOSIS — M810 Age-related osteoporosis without current pathological fracture: Secondary | ICD-10-CM | POA: Diagnosis not present

## 2016-12-11 DIAGNOSIS — Z1231 Encounter for screening mammogram for malignant neoplasm of breast: Secondary | ICD-10-CM | POA: Diagnosis not present

## 2017-01-09 DIAGNOSIS — E785 Hyperlipidemia, unspecified: Secondary | ICD-10-CM | POA: Diagnosis not present

## 2017-01-09 DIAGNOSIS — D539 Nutritional anemia, unspecified: Secondary | ICD-10-CM | POA: Diagnosis not present

## 2017-01-09 DIAGNOSIS — I10 Essential (primary) hypertension: Secondary | ICD-10-CM | POA: Diagnosis not present

## 2017-01-09 DIAGNOSIS — M810 Age-related osteoporosis without current pathological fracture: Secondary | ICD-10-CM | POA: Diagnosis not present

## 2017-01-27 DIAGNOSIS — M810 Age-related osteoporosis without current pathological fracture: Secondary | ICD-10-CM | POA: Diagnosis not present

## 2017-02-06 DIAGNOSIS — H52221 Regular astigmatism, right eye: Secondary | ICD-10-CM | POA: Diagnosis not present

## 2017-02-06 DIAGNOSIS — H524 Presbyopia: Secondary | ICD-10-CM | POA: Diagnosis not present

## 2017-02-06 DIAGNOSIS — I10 Essential (primary) hypertension: Secondary | ICD-10-CM | POA: Diagnosis not present

## 2017-02-06 DIAGNOSIS — H5201 Hypermetropia, right eye: Secondary | ICD-10-CM | POA: Diagnosis not present

## 2017-02-06 DIAGNOSIS — H35371 Puckering of macula, right eye: Secondary | ICD-10-CM | POA: Diagnosis not present

## 2017-02-06 DIAGNOSIS — H35341 Macular cyst, hole, or pseudohole, right eye: Secondary | ICD-10-CM | POA: Diagnosis not present

## 2017-02-19 DIAGNOSIS — Z23 Encounter for immunization: Secondary | ICD-10-CM | POA: Diagnosis not present

## 2017-03-11 ENCOUNTER — Ambulatory Visit (INDEPENDENT_AMBULATORY_CARE_PROVIDER_SITE_OTHER): Payer: Medicare Other | Admitting: Cardiology

## 2017-03-11 ENCOUNTER — Encounter: Payer: Self-pay | Admitting: Cardiology

## 2017-03-11 VITALS — BP 134/64 | HR 56 | Ht 67.0 in | Wt 150.1 lb

## 2017-03-11 DIAGNOSIS — E781 Pure hyperglyceridemia: Secondary | ICD-10-CM

## 2017-03-11 DIAGNOSIS — I10 Essential (primary) hypertension: Secondary | ICD-10-CM | POA: Diagnosis not present

## 2017-03-11 DIAGNOSIS — R002 Palpitations: Secondary | ICD-10-CM

## 2017-03-11 NOTE — Progress Notes (Signed)
Cardiology Office Note:    Date:  03/11/2017   ID:  Veronica Maynard, DOB April 25, 1941, MRN [id number]  PCP:  Veronica Dress, MD  Cardiologist:  Veronica Campus, MD    Referring MD: Veronica Dress, MD   Chief Complaint  Patient presents with  . Follow-up    Routine follow up   I have more palpitations  History of Present Illness:    Veronica Maynard is a 76 y.o. female  with palpitations. She increased herself dose of metoprolol to 75 mg daily and palpitations subsided. Overall she's doing very well. She is very active. She reads a lot she walks a lot overall health admits she is in very good shape. Denies having dizziness or passing out.  Past Medical History:  Diagnosis Date  . Arthritis   . Headache    due to hormone replacement  . Hypertension     Past Surgical History:  Procedure Laterality Date  . TONSILLECTOMY     age 27  . TOTAL HIP ARTHROPLASTY Right 06/08/2014   Procedure: RIGHT TOTAL HIP ARTHROPLASTY ANTERIOR APPROACH;  Surgeon: Veronica Alf, MD;  Location: [facility];  Service: Orthopedics;  Laterality: Right;  . TUBAL LIGATION      Current Medications: Current Meds  Medication Sig  . acetaminophen (TYLENOL) 500 MG tablet Take 1,000 mg by mouth every 6 (six) hours as needed for mild pain or moderate pain.  . fluticasone (FLONASE) 50 MCG/ACT nasal spray Place 2 sprays into both nostrils every evening.  . loratadine (CLARITIN) 10 MG tablet Take 10 mg by mouth daily.  . metoprolol succinate (TOPROL-XL) 50 MG 24 hr tablet Take 75 mg by mouth every evening. Taking 25 mg in the morning and 50 mg in the evening.  . olmesartan (BENICAR) 40 MG tablet Take 40 mg by mouth every morning.  . pravastatin (PRAVACHOL) 20 MG tablet Take 20 mg by mouth daily.  . Wheat Dextrin (BENEFIBER PO) Take 1 packet by mouth 2 (two) times daily.     Allergies:   Rivaroxaban   Social History   Social History  . Marital status: Married    Spouse name: N/A  . Number of children:  N/A  . Years of education: N/A   Social History Main Topics  . Smoking status: Never Smoker  . Smokeless tobacco: Never Used  . Alcohol use Yes     Comment: rarely  . Drug use: No  . Sexual activity: Not Asked   Other Topics Concern  . None   Social History Narrative  . None     Family History: The patient's family history includes Heart disease in her father and mother. ROS:   Please see the history of present illness.    All 14 point review of systems negative except as described per history of present illness  EKGs/Labs/Other Studies Reviewed:      Recent Labs: No results found for requested labs within last 8760 hours.  Recent Lipid Panel No results found for: CHOL, TRIG, HDL, CHOLHDL, VLDL, LDLCALC, LDLDIRECT  Physical Exam:    VS:  BP 134/64   Pulse (!) 56   Ht 5\' 7"  (1.702 m)   Wt 150 lb 1.9 oz (68.1 kg)   SpO2 98%   BMI 23.51 kg/m     Wt Readings from Last 3 Encounters:  03/11/17 150 lb 1.9 oz (68.1 kg)  06/08/14 147 lb (66.7 kg)  06/01/14 147 lb 12.8 oz (67 kg)     GEN:  Well nourished, well developed in no acute distress HEENT: Normal NECK: No JVD; No carotid bruits LYMPHATICS: No lymphadenopathy CARDIAC: RRR, no murmurs, no rubs, no gallops RESPIRATORY:  Clear to auscultation without rales, wheezing or rhonchi  ABDOMEN: Soft, non-tender, non-distended MUSCULOSKELETAL:  No edema; No deformity  SKIN: Warm and dry LOWER EXTREMITIES: no swelling NEUROLOGIC:  Alert and oriented x 3 PSYCHIATRIC:  Normal affect   ASSESSMENT:    1. Essential hypertension   2. Palpitations   3. Pure hypertriglyceridemia    PLAN:    In order of problems listed above:  1. Essential hypertension: Blood pressure well controlled continue present medications. 2. Palpitations: She increased dose of metoprolol and seems to be doing well EKG done today showed normal sinus rhythm rate of 60, normal interval, normal QS complex duration morphology. I will continue 75 mg  of metoprolol succinate. 3. Pure hypertriglyceridemia: Followed by primary care physician will call the office to get fasting lipid profile.   Medication Adjustments/Labs and Tests Ordered: Current medicines are reviewed at length with the patient today.  Concerns regarding medicines are outlined above.  No orders of the defined types were placed in this encounter.  Medication changes: No orders of the defined types were placed in this encounter.   Signed, Veronica Liter, MD, [facility] 03/11/2017 11:09 AM    Veronica Maynard

## 2017-03-11 NOTE — Patient Instructions (Addendum)
Medication Instructions:    1. Avoid all over-the-counter antihistamines except Claritin/Loratadine and Zyrtec/Cetrizine. 2. Avoid all combination including cold sinus allergies flu decongestant and sleep medications 3. You can use Robitussin DM Mucinex and Mucinex DM for cough. 4. can use Tylenol aspirin ibuprofen and naproxen but no combinations such as sleep or sinus.  Labwork: None ordered  Testing/Procedures: EKG today  Follow-Up: Your physician recommends that you schedule a follow-up appointment in: 1 year with Dr. Agustin Cree  Any Other Special Instructions Will Be Listed Below (If Applicable).   Please note that any paperwork needing to be filled out by the provider will need to be addressed at the front desk prior to seeing the provider. Please note that any paperwork FMLA, Disability or other documents regarding health condition is subject to a $25.00 charge that must be received prior to completion of paperwork in the form of a money order or check.     If you need a refill on your cardiac medications before your next appointment, please call your pharmacy.

## 2017-04-17 DIAGNOSIS — L578 Other skin changes due to chronic exposure to nonionizing radiation: Secondary | ICD-10-CM | POA: Diagnosis not present

## 2017-04-17 DIAGNOSIS — D0439 Carcinoma in situ of skin of other parts of face: Secondary | ICD-10-CM | POA: Diagnosis not present

## 2017-04-17 DIAGNOSIS — L570 Actinic keratosis: Secondary | ICD-10-CM | POA: Diagnosis not present

## 2017-04-17 DIAGNOSIS — L821 Other seborrheic keratosis: Secondary | ICD-10-CM | POA: Diagnosis not present

## 2017-05-05 ENCOUNTER — Encounter: Payer: Self-pay | Admitting: Podiatry

## 2017-05-05 ENCOUNTER — Ambulatory Visit (INDEPENDENT_AMBULATORY_CARE_PROVIDER_SITE_OTHER): Payer: Medicare Other | Admitting: Podiatry

## 2017-05-05 VITALS — BP 141/65 | HR 68

## 2017-05-05 DIAGNOSIS — L601 Onycholysis: Secondary | ICD-10-CM | POA: Diagnosis not present

## 2017-05-05 DIAGNOSIS — B351 Tinea unguium: Secondary | ICD-10-CM

## 2017-05-05 DIAGNOSIS — M79674 Pain in right toe(s): Secondary | ICD-10-CM | POA: Diagnosis not present

## 2017-05-05 DIAGNOSIS — M79609 Pain in unspecified limb: Secondary | ICD-10-CM | POA: Diagnosis not present

## 2017-05-05 DIAGNOSIS — L608 Other nail disorders: Secondary | ICD-10-CM | POA: Diagnosis not present

## 2017-05-05 NOTE — Progress Notes (Signed)
  Subjective:  Patient ID: Veronica Maynard, female    DOB: 1940-12-05,  MRN: [id number]  Chief Complaint  Patient presents with  . Nail Problem    right great toe nail has turned dark    76 y.o. female presents with the above complaint.  States her right great toenail has turned black.  Denies known injury.  Believes it happened about 2 months ago.  Reports some pain from the nail.  Past Medical History:  Diagnosis Date  . Arthritis   . Headache    due to hormone replacement  . Hypertension    Past Surgical History:  Procedure Laterality Date  . TONSILLECTOMY     age 14  . TOTAL HIP ARTHROPLASTY Right 06/08/2014   Procedure: RIGHT TOTAL HIP ARTHROPLASTY ANTERIOR APPROACH;  Surgeon: Gearlean Alf, MD;  Location: [facility];  Service: Orthopedics;  Laterality: Right;  . TUBAL LIGATION      Current Outpatient Medications:  .  acetaminophen (TYLENOL) 500 MG tablet, Take 1,000 mg by mouth every 6 (six) hours as needed for mild pain or moderate pain., Disp: , Rfl:  .  fluticasone (FLONASE) 50 MCG/ACT nasal spray, Place 2 sprays into both nostrils every evening., Disp: , Rfl:  .  loratadine (CLARITIN) 10 MG tablet, Take 10 mg by mouth daily., Disp: , Rfl:  .  metoprolol succinate (TOPROL-XL) 50 MG 24 hr tablet, Take 75 mg by mouth every evening. Taking 25 mg in the morning and 50 mg in the evening., Disp: , Rfl:  .  olmesartan (BENICAR) 40 MG tablet, Take 40 mg by mouth every morning., Disp: , Rfl:  .  pravastatin (PRAVACHOL) 20 MG tablet, Take 20 mg by mouth daily., Disp: , Rfl:  .  Wheat Dextrin (BENEFIBER PO), Take 1 packet by mouth 2 (two) times daily., Disp: , Rfl:   Allergies  Allergen Reactions  . Rivaroxaban Hives    "Entire Back had borke out"    Review of Systems Objective:   Vitals:   05/05/17 1348  BP: (!) 141/65  Pulse: 68   General AA&O x3. Normal mood and affect.  Vascular Dorsalis pedis and posterior tibial pulses  present 2+ bilaterally  Capillary refill normal  to all digits. Pedal hair growth normal.  Neurologic Epicritic sensation grossly present.  Dermatologic No open lesions. Interspaces clear of maceration. Nails well groomed and normal in appearance. Right hallux nail with thickening yellow discoloration and subungual hemorrhage noted.  Orthopedic: MMT 5/5 in dorsiflexion, plantarflexion, inversion, and eversion. Normal joint ROM without pain or crepitus.   Assessment & Plan:  Patient was evaluated and treated and all questions answered.  Right hallux nail onychomycosis -Right hallux nail debrided . Nail sample taken and sent for histology and culture -We will review culture results in 5 weeks for possible antifungal therapy  Return in about 5 weeks (around 06/09/2017) for nail fungus f/u.

## 2017-05-22 DIAGNOSIS — D539 Nutritional anemia, unspecified: Secondary | ICD-10-CM | POA: Diagnosis not present

## 2017-05-22 DIAGNOSIS — E785 Hyperlipidemia, unspecified: Secondary | ICD-10-CM | POA: Diagnosis not present

## 2017-05-22 DIAGNOSIS — M545 Low back pain: Secondary | ICD-10-CM | POA: Diagnosis not present

## 2017-05-22 DIAGNOSIS — I10 Essential (primary) hypertension: Secondary | ICD-10-CM | POA: Diagnosis not present

## 2017-05-28 DIAGNOSIS — M545 Low back pain: Secondary | ICD-10-CM | POA: Diagnosis not present

## 2017-05-30 DIAGNOSIS — M545 Low back pain: Secondary | ICD-10-CM | POA: Diagnosis not present

## 2017-06-03 DIAGNOSIS — M545 Low back pain: Secondary | ICD-10-CM | POA: Diagnosis not present

## 2017-06-05 DIAGNOSIS — M545 Low back pain: Secondary | ICD-10-CM | POA: Diagnosis not present

## 2017-06-09 ENCOUNTER — Ambulatory Visit (INDEPENDENT_AMBULATORY_CARE_PROVIDER_SITE_OTHER): Payer: Medicare Other | Admitting: Podiatry

## 2017-06-09 DIAGNOSIS — M79609 Pain in unspecified limb: Secondary | ICD-10-CM

## 2017-06-09 DIAGNOSIS — B351 Tinea unguium: Secondary | ICD-10-CM

## 2017-06-10 DIAGNOSIS — M545 Low back pain: Secondary | ICD-10-CM | POA: Diagnosis not present

## 2017-06-12 DIAGNOSIS — M545 Low back pain: Secondary | ICD-10-CM | POA: Diagnosis not present

## 2017-06-17 DIAGNOSIS — M545 Low back pain: Secondary | ICD-10-CM | POA: Diagnosis not present

## 2017-06-19 DIAGNOSIS — M545 Low back pain: Secondary | ICD-10-CM | POA: Diagnosis not present

## 2017-06-19 NOTE — Progress Notes (Signed)
  Subjective:  Patient ID: Veronica Maynard, female    DOB: 02/17/1941,  MRN: [id number]  Chief Complaint  Patient presents with  . Nail Problem    follow up for culture results    77 y.o. female returns for the above complaint. Here for review of nail fungus results.  Objective:  There were no vitals filed for this visit. General AA&O x3. Normal mood and affect.  Vascular Pedal pulses palpable.  Neurologic Epicritic sensation grossly intact.  Dermatologic No open lesions. Skin normal texture and turgor.  Orthopedic: No pain to palpation either foot.   Assessment & Plan:  Patient was evaluated and treated and all questions answered.  Onychomycosis  -Results reviewed. No e/o onychomycosis -PCR results likely of little clinical value. -Discussed findings of likely microtrauma. -Recommended urea cream for improvement of texture.  No Follow-up on file.

## 2017-06-26 DIAGNOSIS — I10 Essential (primary) hypertension: Secondary | ICD-10-CM | POA: Diagnosis not present

## 2017-06-26 DIAGNOSIS — S0501XA Injury of conjunctiva and corneal abrasion without foreign body, right eye, initial encounter: Secondary | ICD-10-CM | POA: Diagnosis not present

## 2017-06-27 DIAGNOSIS — S0501XD Injury of conjunctiva and corneal abrasion without foreign body, right eye, subsequent encounter: Secondary | ICD-10-CM | POA: Diagnosis not present

## 2017-06-27 DIAGNOSIS — H02052 Trichiasis without entropian right lower eyelid: Secondary | ICD-10-CM | POA: Diagnosis not present

## 2017-07-04 DIAGNOSIS — H1089 Other conjunctivitis: Secondary | ICD-10-CM | POA: Diagnosis not present

## 2017-07-04 DIAGNOSIS — H1031 Unspecified acute conjunctivitis, right eye: Secondary | ICD-10-CM | POA: Diagnosis not present

## 2017-07-07 DIAGNOSIS — H02052 Trichiasis without entropian right lower eyelid: Secondary | ICD-10-CM | POA: Diagnosis not present

## 2017-07-24 DIAGNOSIS — M1611 Unilateral primary osteoarthritis, right hip: Secondary | ICD-10-CM | POA: Diagnosis not present

## 2017-07-24 DIAGNOSIS — Z96641 Presence of right artificial hip joint: Secondary | ICD-10-CM | POA: Diagnosis not present

## 2017-07-24 DIAGNOSIS — Z471 Aftercare following joint replacement surgery: Secondary | ICD-10-CM | POA: Diagnosis not present

## 2017-07-31 DIAGNOSIS — E785 Hyperlipidemia, unspecified: Secondary | ICD-10-CM | POA: Diagnosis not present

## 2017-07-31 DIAGNOSIS — Z1231 Encounter for screening mammogram for malignant neoplasm of breast: Secondary | ICD-10-CM | POA: Diagnosis not present

## 2017-07-31 DIAGNOSIS — Z1331 Encounter for screening for depression: Secondary | ICD-10-CM | POA: Diagnosis not present

## 2017-07-31 DIAGNOSIS — Z0000 Encounter for general adult medical examination without abnormal findings: Secondary | ICD-10-CM | POA: Diagnosis not present

## 2017-07-31 DIAGNOSIS — Z9181 History of falling: Secondary | ICD-10-CM | POA: Diagnosis not present

## 2017-07-31 DIAGNOSIS — Z136 Encounter for screening for cardiovascular disorders: Secondary | ICD-10-CM | POA: Diagnosis not present

## 2017-08-20 DIAGNOSIS — I10 Essential (primary) hypertension: Secondary | ICD-10-CM | POA: Diagnosis not present

## 2017-08-20 DIAGNOSIS — E785 Hyperlipidemia, unspecified: Secondary | ICD-10-CM | POA: Diagnosis not present

## 2017-08-20 DIAGNOSIS — D539 Nutritional anemia, unspecified: Secondary | ICD-10-CM | POA: Diagnosis not present

## 2017-08-20 DIAGNOSIS — M810 Age-related osteoporosis without current pathological fracture: Secondary | ICD-10-CM | POA: Diagnosis not present

## 2017-08-20 DIAGNOSIS — I471 Supraventricular tachycardia: Secondary | ICD-10-CM | POA: Diagnosis not present

## 2017-08-20 DIAGNOSIS — H9193 Unspecified hearing loss, bilateral: Secondary | ICD-10-CM | POA: Diagnosis not present

## 2017-12-02 DIAGNOSIS — D539 Nutritional anemia, unspecified: Secondary | ICD-10-CM | POA: Diagnosis not present

## 2017-12-02 DIAGNOSIS — I10 Essential (primary) hypertension: Secondary | ICD-10-CM | POA: Diagnosis not present

## 2017-12-02 DIAGNOSIS — E785 Hyperlipidemia, unspecified: Secondary | ICD-10-CM | POA: Diagnosis not present

## 2017-12-02 DIAGNOSIS — E871 Hypo-osmolality and hyponatremia: Secondary | ICD-10-CM | POA: Diagnosis not present

## 2017-12-17 DIAGNOSIS — Z1231 Encounter for screening mammogram for malignant neoplasm of breast: Secondary | ICD-10-CM | POA: Diagnosis not present

## 2017-12-18 DIAGNOSIS — H903 Sensorineural hearing loss, bilateral: Secondary | ICD-10-CM | POA: Diagnosis not present

## 2018-02-18 DIAGNOSIS — Z23 Encounter for immunization: Secondary | ICD-10-CM | POA: Diagnosis not present

## 2018-03-02 DIAGNOSIS — M810 Age-related osteoporosis without current pathological fracture: Secondary | ICD-10-CM | POA: Diagnosis not present

## 2018-03-10 DIAGNOSIS — E785 Hyperlipidemia, unspecified: Secondary | ICD-10-CM | POA: Diagnosis not present

## 2018-03-10 DIAGNOSIS — I10 Essential (primary) hypertension: Secondary | ICD-10-CM | POA: Diagnosis not present

## 2018-03-10 DIAGNOSIS — E559 Vitamin D deficiency, unspecified: Secondary | ICD-10-CM | POA: Diagnosis not present

## 2018-03-10 DIAGNOSIS — D539 Nutritional anemia, unspecified: Secondary | ICD-10-CM | POA: Diagnosis not present

## 2018-03-10 DIAGNOSIS — E871 Hypo-osmolality and hyponatremia: Secondary | ICD-10-CM | POA: Diagnosis not present

## 2018-03-10 DIAGNOSIS — Z1339 Encounter for screening examination for other mental health and behavioral disorders: Secondary | ICD-10-CM | POA: Diagnosis not present

## 2018-04-03 ENCOUNTER — Ambulatory Visit (INDEPENDENT_AMBULATORY_CARE_PROVIDER_SITE_OTHER): Payer: Medicare Other | Admitting: Cardiology

## 2018-04-03 ENCOUNTER — Encounter: Payer: Self-pay | Admitting: Cardiology

## 2018-04-03 VITALS — BP 110/62 | HR 65 | Ht 67.0 in | Wt 155.4 lb

## 2018-04-03 DIAGNOSIS — R002 Palpitations: Secondary | ICD-10-CM | POA: Diagnosis not present

## 2018-04-03 DIAGNOSIS — E781 Pure hyperglyceridemia: Secondary | ICD-10-CM

## 2018-04-03 DIAGNOSIS — I10 Essential (primary) hypertension: Secondary | ICD-10-CM | POA: Diagnosis not present

## 2018-04-03 NOTE — Progress Notes (Signed)
Cardiology Office Note:    Date:  04/03/2018   ID:  TAYLORANN TKACH, DOB [birth year], MRN [id number]  PCP:  Nicoletta Dress, MD  Cardiologist:  Jenne Campus, MD    Referring MD: Nicoletta Dress, MD   Chief Complaint  Patient presents with  . Follow-up  Doing well  History of Present Illness:    Veronica Maynard is a 77 y.o. female with hypertension as well as palpitations overall she is doing very well she is very active she can walk climb stairs with no difficulties.  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  No chest pain tightness squeezing pressure burning chest  Past Medical History:  Diagnosis Date  . Arthritis   . Headache    due to hormone replacement  . Hypertension     Past Surgical History:  Procedure Laterality Date  . TONSILLECTOMY     age 38  . TOTAL HIP ARTHROPLASTY Right 06/08/2014   Procedure: RIGHT TOTAL HIP ARTHROPLASTY ANTERIOR APPROACH;  Surgeon: Gearlean Alf, MD;  Location: [facility];  Service: Orthopedics;  Laterality: Right;  . TUBAL LIGATION      Current Medications: Current Meds  Medication Sig  . acetaminophen (TYLENOL) 500 MG tablet Take 1,000 mg by mouth every 6 (six) hours as needed for mild pain or moderate pain.  . fluticasone (FLONASE) 50 MCG/ACT nasal spray Place 2 sprays into both nostrils every evening.  . loratadine (CLARITIN) 10 MG tablet Take 10 mg by mouth daily.  . metoprolol succinate (TOPROL-XL) 50 MG 24 hr tablet Take 75 mg by mouth every evening. Taking 25 mg in the morning and 50 mg in the evening.  . pravastatin (PRAVACHOL) 20 MG tablet Take 20 mg by mouth daily.  Marland Kitchen telmisartan (MICARDIS) 80 MG tablet Take 1 tablet by mouth daily.  . Wheat Dextrin (BENEFIBER PO) Take 1 packet by mouth 2 (two) times daily.     Allergies:   Rivaroxaban   Social History   Socioeconomic History  . Marital status: Married    Spouse name: Not on file  . Number of children: Not on file  . Years of  education: Not on file  . Highest education level: Not on file  Occupational History  . Not on file  Social Needs  . Financial resource strain: Not on file  . Food insecurity:    Worry: Not on file    Inability: Not on file  . Transportation needs:    Medical: Not on file    Non-medical: Not on file  Tobacco Use  . Smoking status: Never Smoker  . Smokeless tobacco: Never Used  Substance and Sexual Activity  . Alcohol use: Yes    Comment: rarely  . Drug use: No  . Sexual activity: Not on file  Lifestyle  . Physical activity:    Days per week: Not on file    Minutes per session: Not on file  . Stress: Not on file  Relationships  . Social connections:    Talks on phone: Not on file    Gets together: Not on file    Attends religious service: Not on file    Active member of club or organization: Not on file    Attends meetings of clubs or organizations: Not on file    Relationship status: Not on file  Other Topics Concern  . Not on file  Social History Narrative  . Not on file  Family History: The patient's family history includes Heart disease in her father and mother. ROS:   Please see the history of present illness.    All 14 point review of systems negative except as described per history of present illness  EKGs/Labs/Other Studies Reviewed:    Normal sinus rhythm normal P interval normal QS complex duration morphology no ST segment changes  Recent Labs: No results found for requested labs within last 8760 hours.  Recent Lipid Panel No results found for: CHOL, TRIG, HDL, CHOLHDL, VLDL, LDLCALC, LDLDIRECT  Physical Exam:    VS:  BP 110/62   Pulse 65   Ht 5\' 7"  (1.702 m)   Wt 155 lb 6.4 oz (70.5 kg)   SpO2 98%   BMI 24.34 kg/m     Wt Readings from Last 3 Encounters:  04/03/18 155 lb 6.4 oz (70.5 kg)  03/11/17 150 lb 1.9 oz (68.1 kg)  06/08/14 147 lb (66.7 kg)     GEN:  Well nourished, well developed in no acute distress HEENT: Normal NECK: No  JVD; No carotid bruits LYMPHATICS: No lymphadenopathy CARDIAC: RRR, no murmurs, no rubs, no gallops RESPIRATORY:  Clear to auscultation without rales, wheezing or rhonchi  ABDOMEN: Soft, non-tender, non-distended MUSCULOSKELETAL:  No edema; No deformity  SKIN: Warm and dry LOWER EXTREMITIES: no swelling NEUROLOGIC:  Alert and oriented x 3 PSYCHIATRIC:  Normal affect   ASSESSMENT:    1. Essential hypertension   2. Palpitations   3. Pure hypertriglyceridemia    PLAN:    In order of problems listed above:  1. Essential hypertension she is doing well from that point review she brought blood pressure measurements and those are fine.  Her blood pressures well today as well.  We will continue all medications the way she is 2. Palpitations successfully suppressed with metoprolol 75 mg daily which I will continue. 3. Dyslipidemia she will have fasting lipid profile done.   Medication Adjustments/Labs and Tests Ordered: Current medicines are reviewed at length with the patient today.  Concerns regarding medicines are outlined above.  No orders of the defined types were placed in this encounter.  Medication changes: No orders of the defined types were placed in this encounter.   Signed, Park Liter, MD, [facility] 04/03/2018 9:24 AM    [place]

## 2018-04-03 NOTE — Patient Instructions (Signed)
Medication Instructions:  Your physician recommends that you continue on your current medications as directed. Please refer to the Current Medication list given to you today.  If you need a refill on your cardiac medications before your next appointment, please call your pharmacy.   Lab work: None.   If you have labs (blood work) drawn today and your tests are completely normal, you will receive your results only by: . MyChart Message (if you have MyChart) OR . A paper copy in the mail If you have any lab test that is abnormal or we need to change your treatment, we will call you to review the results.  Testing/Procedures: None.   Follow-Up: At [facility], you and your health needs are our priority.  As part of our continuing mission to provide you with exceptional heart care, we have created designated Provider Care Teams.  These Care Teams include your primary Cardiologist (physician) and Advanced Practice Providers (APPs -  Physician Assistants and Nurse Practitioners) who all work together to provide you with the care you need, when you need it. You will need a follow up appointment in 6 months.  Please call our office 2 months in advance to schedule this appointment.  You may see No primary care provider on file. or another member of our [facility] Provider Team in [place]: Brian Munley, MD . Rajan Revankar, MD  Any Other Special Instructions Will Be Listed Below (If Applicable).    

## 2018-04-10 DIAGNOSIS — E875 Hyperkalemia: Secondary | ICD-10-CM | POA: Diagnosis not present

## 2018-04-23 DIAGNOSIS — Z6823 Body mass index (BMI) 23.0-23.9, adult: Secondary | ICD-10-CM | POA: Diagnosis not present

## 2018-04-23 DIAGNOSIS — K5909 Other constipation: Secondary | ICD-10-CM | POA: Diagnosis not present

## 2018-04-23 DIAGNOSIS — K5900 Constipation, unspecified: Secondary | ICD-10-CM | POA: Diagnosis not present

## 2018-04-23 DIAGNOSIS — R194 Change in bowel habit: Secondary | ICD-10-CM | POA: Diagnosis not present

## 2018-05-05 DIAGNOSIS — H35341 Macular cyst, hole, or pseudohole, right eye: Secondary | ICD-10-CM | POA: Diagnosis not present

## 2018-05-05 DIAGNOSIS — Z961 Presence of intraocular lens: Secondary | ICD-10-CM | POA: Diagnosis not present

## 2018-05-05 DIAGNOSIS — Z9849 Cataract extraction status, unspecified eye: Secondary | ICD-10-CM | POA: Diagnosis not present

## 2018-05-05 DIAGNOSIS — H35371 Puckering of macula, right eye: Secondary | ICD-10-CM | POA: Diagnosis not present

## 2018-05-05 DIAGNOSIS — H52221 Regular astigmatism, right eye: Secondary | ICD-10-CM | POA: Diagnosis not present

## 2018-05-05 DIAGNOSIS — H524 Presbyopia: Secondary | ICD-10-CM | POA: Diagnosis not present

## 2018-05-05 DIAGNOSIS — H5201 Hypermetropia, right eye: Secondary | ICD-10-CM | POA: Diagnosis not present

## 2018-06-18 DIAGNOSIS — E785 Hyperlipidemia, unspecified: Secondary | ICD-10-CM | POA: Diagnosis not present

## 2018-06-18 DIAGNOSIS — I10 Essential (primary) hypertension: Secondary | ICD-10-CM | POA: Diagnosis not present

## 2018-06-18 DIAGNOSIS — R194 Change in bowel habit: Secondary | ICD-10-CM | POA: Diagnosis not present

## 2018-06-18 DIAGNOSIS — D539 Nutritional anemia, unspecified: Secondary | ICD-10-CM | POA: Diagnosis not present

## 2018-06-18 DIAGNOSIS — E871 Hypo-osmolality and hyponatremia: Secondary | ICD-10-CM | POA: Diagnosis not present

## 2018-07-07 DIAGNOSIS — L578 Other skin changes due to chronic exposure to nonionizing radiation: Secondary | ICD-10-CM | POA: Diagnosis not present

## 2018-07-07 DIAGNOSIS — L821 Other seborrheic keratosis: Secondary | ICD-10-CM | POA: Diagnosis not present

## 2018-07-07 DIAGNOSIS — L301 Dyshidrosis [pompholyx]: Secondary | ICD-10-CM | POA: Diagnosis not present

## 2018-07-07 DIAGNOSIS — L570 Actinic keratosis: Secondary | ICD-10-CM | POA: Diagnosis not present

## 2018-07-16 DIAGNOSIS — K5900 Constipation, unspecified: Secondary | ICD-10-CM | POA: Diagnosis not present

## 2018-07-24 DIAGNOSIS — Z6824 Body mass index (BMI) 24.0-24.9, adult: Secondary | ICD-10-CM | POA: Diagnosis not present

## 2018-07-24 DIAGNOSIS — H53132 Sudden visual loss, left eye: Secondary | ICD-10-CM | POA: Diagnosis not present

## 2018-07-24 DIAGNOSIS — I10 Essential (primary) hypertension: Secondary | ICD-10-CM | POA: Diagnosis not present

## 2018-07-27 DIAGNOSIS — Z6824 Body mass index (BMI) 24.0-24.9, adult: Secondary | ICD-10-CM | POA: Diagnosis not present

## 2018-07-27 DIAGNOSIS — I10 Essential (primary) hypertension: Secondary | ICD-10-CM | POA: Diagnosis not present

## 2018-07-27 DIAGNOSIS — H53132 Sudden visual loss, left eye: Secondary | ICD-10-CM | POA: Diagnosis not present

## 2018-07-27 DIAGNOSIS — I471 Supraventricular tachycardia: Secondary | ICD-10-CM | POA: Diagnosis not present

## 2018-07-30 DIAGNOSIS — I083 Combined rheumatic disorders of mitral, aortic and tricuspid valves: Secondary | ICD-10-CM | POA: Diagnosis not present

## 2018-07-30 DIAGNOSIS — I471 Supraventricular tachycardia: Secondary | ICD-10-CM | POA: Diagnosis not present

## 2018-07-31 DIAGNOSIS — I6523 Occlusion and stenosis of bilateral carotid arteries: Secondary | ICD-10-CM | POA: Diagnosis not present

## 2018-07-31 DIAGNOSIS — H53132 Sudden visual loss, left eye: Secondary | ICD-10-CM | POA: Diagnosis not present

## 2018-08-04 DIAGNOSIS — I10 Essential (primary) hypertension: Secondary | ICD-10-CM | POA: Diagnosis not present

## 2018-08-04 DIAGNOSIS — Z1331 Encounter for screening for depression: Secondary | ICD-10-CM | POA: Diagnosis not present

## 2018-08-04 DIAGNOSIS — Z1231 Encounter for screening mammogram for malignant neoplasm of breast: Secondary | ICD-10-CM | POA: Diagnosis not present

## 2018-08-04 DIAGNOSIS — Z9181 History of falling: Secondary | ICD-10-CM | POA: Diagnosis not present

## 2018-08-04 DIAGNOSIS — Z0000 Encounter for general adult medical examination without abnormal findings: Secondary | ICD-10-CM | POA: Diagnosis not present

## 2018-08-04 DIAGNOSIS — Z6824 Body mass index (BMI) 24.0-24.9, adult: Secondary | ICD-10-CM | POA: Diagnosis not present

## 2018-08-04 DIAGNOSIS — Z136 Encounter for screening for cardiovascular disorders: Secondary | ICD-10-CM | POA: Diagnosis not present

## 2018-08-04 DIAGNOSIS — N959 Unspecified menopausal and perimenopausal disorder: Secondary | ICD-10-CM | POA: Diagnosis not present

## 2018-08-04 DIAGNOSIS — E785 Hyperlipidemia, unspecified: Secondary | ICD-10-CM | POA: Diagnosis not present

## 2018-08-10 DIAGNOSIS — I10 Essential (primary) hypertension: Secondary | ICD-10-CM | POA: Diagnosis not present

## 2018-08-10 DIAGNOSIS — H53132 Sudden visual loss, left eye: Secondary | ICD-10-CM | POA: Diagnosis not present

## 2018-08-10 DIAGNOSIS — I471 Supraventricular tachycardia: Secondary | ICD-10-CM | POA: Diagnosis not present

## 2018-08-10 DIAGNOSIS — Z6824 Body mass index (BMI) 24.0-24.9, adult: Secondary | ICD-10-CM | POA: Diagnosis not present

## 2018-09-08 ENCOUNTER — Telehealth: Payer: Self-pay | Admitting: Cardiology

## 2018-09-08 NOTE — Telephone Encounter (Signed)
Virtual Visit Pre-Appointment Phone Call  "(Name), I am calling you today to discuss your upcoming appointment. We are currently trying to limit exposure to the virus that causes COVID-19 by seeing patients at home rather than in the office."  1. "What is the BEST phone number to call the day of the visit?" - include this in appointment notes  2. Do you have or have access to (through a family member/friend) a smartphone with video capability that we can use for your visit?" a. If yes - list this number in appt notes as cell (if different from BEST phone #) and list the appointment type as a VIDEO visit in appointment notes b. If no - list the appointment type as a PHONE visit in appointment notes  3. Confirm consent - "In the setting of the current Covid19 crisis, you are scheduled for a (phone or video) visit with your provider on (date) at (time).  Just as we do with many in-office visits, in order for you to participate in this visit, we must obtain consent.  If you'd like, I can send this to your mychart (if signed up) or email for you to review.  Otherwise, I can obtain your verbal consent now.  All virtual visits are billed to your insurance company just like a normal visit would be.  By agreeing to a virtual visit, we'd like you to understand that the technology does not allow for your provider to perform an examination, and thus may limit your provider's ability to fully assess your condition. If your provider identifies any concerns that need to be evaluated in person, we will make arrangements to do so.  Finally, though the technology is pretty good, we cannot assure that it will always work on either your or our end, and in the setting of a video visit, we may have to convert it to a phone-only visit.  In either situation, we cannot ensure that we have a secure connection.  Are you willing to proceed?" STAFF: Did the patient verbally acknowledge consent to telehealth visit? Document  YES/NO here: YES  4. Advise patient to be prepared - "Two hours prior to your appointment, go ahead and check your blood pressure, pulse, oxygen saturation, and your weight (if you have the equipment to check those) and write them all down. When your visit starts, your provider will ask you for this information. If you have an Apple Watch or Kardia device, please plan to have heart rate information ready on the day of your appointment. Please have a pen and paper handy nearby the day of the visit as well."  5. Give patient instructions for MyChart download to smartphone OR [email or web address].me as below if video visit (depending on what platform provider is using)  6. Inform patient they will receive a phone call 15 minutes prior to their appointment time (may be from unknown caller ID) so they should be prepared to answer    TELEPHONE CALL NOTE  PEGAH SEGEL has been deemed a candidate for a follow-up tele-health visit to limit community exposure during the Covid-19 pandemic. I spoke with the patient via phone to ensure availability of phone/video source, confirm preferred email & phone number, and discuss instructions and expectations.  I reminded CRYSTLE CARELLI to be prepared with any vital sign and/or heart rhythm information that could potentially be obtained via home monitoring, at the time of her visit. I reminded ERLENE DEVITA to expect a phone call prior to  her visit.  Isaiah Blakes 09/08/2018 3:29 PM   INSTRUCTIONS FOR DOWNLOADING THE MYCHART APP TO SMARTPHONE  - The patient must first make sure to have activated MyChart and know their login information - If Apple, go to [employer] and type in MyChart in the search bar and download the app. If Android, ask patient to go to [employer] and type in [place] in the search bar and download the app. The app is free but as with any other app downloads, their phone may require them to verify saved payment information or  Apple/Android password.  - The patient will need to then log into the app with their MyChart username and password, and select [facility] as their healthcare provider to link the account. When it is time for your visit, go to the MyChart app, find appointments, and click Begin Video Visit. Be sure to Select Allow for your device to access the Microphone and Camera for your visit. You will then be connected, and your provider will be with you shortly.  **If they have any issues connecting, or need assistance please contact MyChart service desk [phone])**  **If using a computer, in order to ensure the best quality for their visit they will need to use either of the following Internet Browsers: [employer], or Google Chrome**  IF USING DOXIMITY or DOXY.ME - The patient will receive a link just prior to their visit by text.     FULL LENGTH CONSENT FOR TELE-HEALTH VISIT   I hereby voluntarily request, consent and authorize [place] and its employed or contracted physicians, physician assistants, nurse practitioners or other licensed health care professionals (the Practitioner), to provide me with telemedicine health care services (the Services") as deemed necessary by the treating Practitioner. I acknowledge and consent to receive the Services by the Practitioner via telemedicine. I understand that the telemedicine visit will involve communicating with the Practitioner through live audiovisual communication technology and the disclosure of certain medical information by electronic transmission. I acknowledge that I have been given the opportunity to request an in-person assessment or other available alternative prior to the telemedicine visit and am voluntarily participating in the telemedicine visit.  I understand that I have the right to withhold or withdraw my consent to the use of telemedicine in the course of my care at any time, without affecting my right to future care  or treatment, and that the Practitioner or I may terminate the telemedicine visit at any time. I understand that I have the right to inspect all information obtained and/or recorded in the course of the telemedicine visit and may receive copies of available information for a reasonable fee.  I understand that some of the potential risks of receiving the Services via telemedicine include:   Delay or interruption in medical evaluation due to technological equipment failure or disruption;  Information transmitted may not be sufficient (e.g. poor resolution of images) to allow for appropriate medical decision making by the Practitioner; and/or   In rare instances, security protocols could fail, causing a breach of personal health information.  Furthermore, I acknowledge that it is my responsibility to provide information about my medical history, conditions and care that is complete and accurate to the best of my ability. I acknowledge that Practitioner's advice, recommendations, and/or decision may be based on factors not within their control, such as incomplete or inaccurate data provided by me or distortions of diagnostic images or specimens that may result from electronic transmissions. I  understand that the practice of medicine is not an exact science and that Practitioner makes no warranties or guarantees regarding treatment outcomes. I acknowledge that I will receive a copy of this consent concurrently upon execution via email to the email address I last provided but may also request a printed copy by calling the office of [place].    I understand that my insurance will be billed for this visit.   I have read or had this consent read to me.  I understand the contents of this consent, which adequately explains the benefits and risks of the Services being provided via telemedicine.   I have been provided ample opportunity to ask questions regarding this consent and the Services and have had  my questions answered to my satisfaction.  I give my informed consent for the services to be provided through the use of telemedicine in my medical care  By participating in this telemedicine visit I agree to the above.

## 2018-09-09 DIAGNOSIS — M810 Age-related osteoporosis without current pathological fracture: Secondary | ICD-10-CM | POA: Diagnosis not present

## 2018-09-23 ENCOUNTER — Telehealth (INDEPENDENT_AMBULATORY_CARE_PROVIDER_SITE_OTHER): Payer: Medicare Other | Admitting: Cardiology

## 2018-09-23 ENCOUNTER — Other Ambulatory Visit: Payer: Self-pay

## 2018-09-23 ENCOUNTER — Encounter: Payer: Self-pay | Admitting: Cardiology

## 2018-09-23 VITALS — BP 105/61 | HR 71

## 2018-09-23 DIAGNOSIS — R002 Palpitations: Secondary | ICD-10-CM

## 2018-09-23 DIAGNOSIS — I10 Essential (primary) hypertension: Secondary | ICD-10-CM

## 2018-09-23 DIAGNOSIS — E781 Pure hyperglyceridemia: Secondary | ICD-10-CM

## 2018-09-23 NOTE — Patient Instructions (Signed)

## 2018-09-23 NOTE — Progress Notes (Signed)
Virtual Visit via Telephone Note   This visit type was conducted due to national recommendations for restrictions regarding the COVID-19 Pandemic (e.g. social distancing) in an effort to limit this patient's exposure and mitigate transmission in our community.  Due to her co-morbid illnesses, this patient is at least at moderate risk for complications without adequate follow up.  This format is felt to be most appropriate for this patient at this time.  The patient did not have access to video technology/had technical difficulties with video requiring transitioning to audio format only (telephone).  All issues noted in this document were discussed and addressed.  No physical exam could be performed with this format.  Please refer to the patient's chart for her  consent to telehealth for [facility].  Evaluation Performed:  Follow-up visit  This visit type was conducted due to national recommendations for restrictions regarding the COVID-19 Pandemic (e.g. social distancing).  This format is felt to be most appropriate for this patient at this time.  All issues noted in this document were discussed and addressed.  No physical exam was performed (except for noted visual exam findings with Video Visits).  Please refer to the patient's chart (MyChart message for video visits and phone note for telephone visits) for the patient's consent to telehealth for [facility].  Date:  09/23/2018  ID: Veronica Maynard, DOB 1941/03/20, MRN [id number]   Patient Location: [place] [address]   Provider location:   [place] Office  PCP:  Nicoletta Dress, MD  Cardiologist:  Jenne Campus, MD     Chief Complaint: I am doing very well now  History of Present Illness:    Veronica Maynard is a 78 y.o. female  who presents via audio/video conferencing for a telehealth visit today.  With hypertension palpitation and dyslipidemia overall she is doing well in March she ended  going to the hospital she started having some vision problems she checked her blood pressure her blood pressure was very high and she ended up going to the hospital quite extensive evaluation has been done that include CT of her head which was normal she also had echocardiogram which showed preserved left ventricular ejection fraction carotic ultrasound was done as well which showed no significant narrowing.  She was discharged home also her medications were mildly adjusted since that time she is doing well.  She denies having any chest pain, tightness, pressure, burning in the chest no palpitations.   The patient does not have symptoms concerning for COVID-19 infection (fever, chills, cough, or new SHORTNESS OF BREATH).    Prior CV studies:   The following studies were reviewed today:       Past Medical History:  Diagnosis Date  . Arthritis   . Headache    due to hormone replacement  . Hypertension     Past Surgical History:  Procedure Laterality Date  . TONSILLECTOMY     age 30  . TOTAL HIP ARTHROPLASTY Right 06/08/2014   Procedure: RIGHT TOTAL HIP ARTHROPLASTY ANTERIOR APPROACH;  Surgeon: Gearlean Alf, MD;  Location: [facility];  Service: Orthopedics;  Laterality: Right;  . TUBAL LIGATION       Current Meds  Medication Sig  . acetaminophen (TYLENOL) 500 MG tablet Take 1,000 mg by mouth every 6 (six) hours as needed for mild pain or moderate pain.  Marland Kitchen amLODipine (NORVASC) 2.5 MG tablet Take 1 tablet by mouth daily.  Marland Kitchen aspirin EC 81  MG tablet Take 81 mg by mouth 2 (two) times daily.  . fluticasone (FLONASE) 50 MCG/ACT nasal spray Place 2 sprays into both nostrils every evening.  . loratadine (CLARITIN) 10 MG tablet Take 10 mg by mouth daily.  . metoprolol succinate (TOPROL-XL) 50 MG 24 hr tablet Take 50 mg by mouth every evening.   . olmesartan (BENICAR) 40 MG tablet Take 1 tablet by mouth daily.  . pravastatin (PRAVACHOL) 20 MG tablet Take 20 mg by mouth daily.  . Wheat  Dextrin (BENEFIBER PO) Take 1 packet by mouth 2 (two) times daily.      Family History: The patient's family history includes Heart disease in her father and mother.   ROS:   Please see the history of present illness.     All other systems reviewed and are negative.   Labs/Other Tests and Data Reviewed:     Recent Labs: No results found for requested labs within last 8760 hours.  Recent Lipid Panel No results found for: CHOL, TRIG, HDL, CHOLHDL, VLDL, LDLCALC, LDLDIRECT    Exam:    Vital Signs:  BP 105/61   Pulse 71     Wt Readings from Last 3 Encounters:  04/03/18 155 lb 6.4 oz (70.5 kg)  03/11/17 150 lb 1.9 oz (68.1 kg)  06/08/14 147 lb (66.7 kg)     Well nourished, well developed in no acute distress. Alert awake in exam 3.  She did not have technical ability to do video link therefore we did phone conversation she was not in any distress when I was talking to her she was actually happy to be able to talk to me.  Diagnosis for this visit:   1. Essential hypertension   2. Palpitations   3. Pure hypertriglyceridemia      ASSESSMENT & PLAN:    1.  Essential hypertension blood pressure well controlled right now we will continue present management. 2.  Palpitations denies having any. 3.  Dyslipidemia.  I will get fasting lipid profile from primary care physician.  In the meantime we will continue pravastatin.  COVID-19 Education: The signs and symptoms of COVID-19 were discussed with the patient and how to seek care for testing (follow up with PCP or arrange E-visit).  The importance of social distancing was discussed today.  Patient Risk:   After full review of this patients clinical status, I feel that they are at least moderate risk at this time.  Time:   Today, I have spent 16 minutes with the patient with telehealth technology discussing pt health issues.  I spent 5 minutes reviewing her chart before the visit.  Visit was finished at 9:04 AM.     Medication Adjustments/Labs and Tests Ordered: Current medicines are reviewed at length with the patient today.  Concerns regarding medicines are outlined above.  No orders of the defined types were placed in this encounter.  Medication changes: No orders of the defined types were placed in this encounter.    Disposition: Follow-up in 5 months I reviewed records from hospital for this visit  Signed, Park Liter, MD, [facility] 09/23/2018 9:06 AM    Veronica Maynard

## 2018-09-24 DIAGNOSIS — D539 Nutritional anemia, unspecified: Secondary | ICD-10-CM | POA: Diagnosis not present

## 2018-09-24 DIAGNOSIS — R194 Change in bowel habit: Secondary | ICD-10-CM | POA: Diagnosis not present

## 2018-09-24 DIAGNOSIS — Z23 Encounter for immunization: Secondary | ICD-10-CM | POA: Diagnosis not present

## 2018-09-24 DIAGNOSIS — E785 Hyperlipidemia, unspecified: Secondary | ICD-10-CM | POA: Diagnosis not present

## 2018-09-24 DIAGNOSIS — E871 Hypo-osmolality and hyponatremia: Secondary | ICD-10-CM | POA: Diagnosis not present

## 2018-09-24 DIAGNOSIS — I10 Essential (primary) hypertension: Secondary | ICD-10-CM | POA: Diagnosis not present

## 2018-10-05 DIAGNOSIS — Z1211 Encounter for screening for malignant neoplasm of colon: Secondary | ICD-10-CM | POA: Diagnosis not present

## 2018-10-05 DIAGNOSIS — Z1212 Encounter for screening for malignant neoplasm of rectum: Secondary | ICD-10-CM | POA: Diagnosis not present

## 2018-12-10 DIAGNOSIS — M549 Dorsalgia, unspecified: Secondary | ICD-10-CM | POA: Diagnosis not present

## 2018-12-10 DIAGNOSIS — I10 Essential (primary) hypertension: Secondary | ICD-10-CM | POA: Diagnosis not present

## 2018-12-10 DIAGNOSIS — Z6823 Body mass index (BMI) 23.0-23.9, adult: Secondary | ICD-10-CM | POA: Diagnosis not present

## 2018-12-16 DIAGNOSIS — M25512 Pain in left shoulder: Secondary | ICD-10-CM | POA: Diagnosis not present

## 2018-12-16 DIAGNOSIS — M546 Pain in thoracic spine: Secondary | ICD-10-CM | POA: Diagnosis not present

## 2018-12-18 DIAGNOSIS — M25512 Pain in left shoulder: Secondary | ICD-10-CM | POA: Diagnosis not present

## 2018-12-18 DIAGNOSIS — M546 Pain in thoracic spine: Secondary | ICD-10-CM | POA: Diagnosis not present

## 2018-12-21 DIAGNOSIS — M8589 Other specified disorders of bone density and structure, multiple sites: Secondary | ICD-10-CM | POA: Diagnosis not present

## 2018-12-21 DIAGNOSIS — Z1231 Encounter for screening mammogram for malignant neoplasm of breast: Secondary | ICD-10-CM | POA: Diagnosis not present

## 2018-12-21 DIAGNOSIS — N959 Unspecified menopausal and perimenopausal disorder: Secondary | ICD-10-CM | POA: Diagnosis not present

## 2018-12-22 DIAGNOSIS — M25512 Pain in left shoulder: Secondary | ICD-10-CM | POA: Diagnosis not present

## 2018-12-22 DIAGNOSIS — M546 Pain in thoracic spine: Secondary | ICD-10-CM | POA: Diagnosis not present

## 2018-12-24 DIAGNOSIS — M25512 Pain in left shoulder: Secondary | ICD-10-CM | POA: Diagnosis not present

## 2018-12-24 DIAGNOSIS — M546 Pain in thoracic spine: Secondary | ICD-10-CM | POA: Diagnosis not present

## 2018-12-25 DIAGNOSIS — E785 Hyperlipidemia, unspecified: Secondary | ICD-10-CM | POA: Diagnosis not present

## 2018-12-25 DIAGNOSIS — Z139 Encounter for screening, unspecified: Secondary | ICD-10-CM | POA: Diagnosis not present

## 2018-12-25 DIAGNOSIS — I10 Essential (primary) hypertension: Secondary | ICD-10-CM | POA: Diagnosis not present

## 2018-12-25 DIAGNOSIS — D539 Nutritional anemia, unspecified: Secondary | ICD-10-CM | POA: Diagnosis not present

## 2018-12-25 DIAGNOSIS — E871 Hypo-osmolality and hyponatremia: Secondary | ICD-10-CM | POA: Diagnosis not present

## 2018-12-29 DIAGNOSIS — M25512 Pain in left shoulder: Secondary | ICD-10-CM | POA: Diagnosis not present

## 2018-12-29 DIAGNOSIS — M546 Pain in thoracic spine: Secondary | ICD-10-CM | POA: Diagnosis not present

## 2018-12-31 DIAGNOSIS — M546 Pain in thoracic spine: Secondary | ICD-10-CM | POA: Diagnosis not present

## 2018-12-31 DIAGNOSIS — M25512 Pain in left shoulder: Secondary | ICD-10-CM | POA: Diagnosis not present

## 2019-01-04 DIAGNOSIS — M25512 Pain in left shoulder: Secondary | ICD-10-CM | POA: Diagnosis not present

## 2019-01-04 DIAGNOSIS — M546 Pain in thoracic spine: Secondary | ICD-10-CM | POA: Diagnosis not present

## 2019-01-05 DIAGNOSIS — E871 Hypo-osmolality and hyponatremia: Secondary | ICD-10-CM | POA: Diagnosis not present

## 2019-01-06 DIAGNOSIS — M25512 Pain in left shoulder: Secondary | ICD-10-CM | POA: Diagnosis not present

## 2019-01-06 DIAGNOSIS — M546 Pain in thoracic spine: Secondary | ICD-10-CM | POA: Diagnosis not present

## 2019-01-07 DIAGNOSIS — R922 Inconclusive mammogram: Secondary | ICD-10-CM | POA: Diagnosis not present

## 2019-01-07 DIAGNOSIS — R928 Other abnormal and inconclusive findings on diagnostic imaging of breast: Secondary | ICD-10-CM | POA: Diagnosis not present

## 2019-01-08 DIAGNOSIS — E875 Hyperkalemia: Secondary | ICD-10-CM | POA: Diagnosis not present

## 2019-01-08 DIAGNOSIS — M549 Dorsalgia, unspecified: Secondary | ICD-10-CM | POA: Diagnosis not present

## 2019-01-08 DIAGNOSIS — Z6823 Body mass index (BMI) 23.0-23.9, adult: Secondary | ICD-10-CM | POA: Diagnosis not present

## 2019-01-12 DIAGNOSIS — M542 Cervicalgia: Secondary | ICD-10-CM | POA: Diagnosis not present

## 2019-01-12 DIAGNOSIS — M5412 Radiculopathy, cervical region: Secondary | ICD-10-CM | POA: Diagnosis not present

## 2019-02-10 DIAGNOSIS — M25512 Pain in left shoulder: Secondary | ICD-10-CM | POA: Diagnosis not present

## 2019-02-10 DIAGNOSIS — M542 Cervicalgia: Secondary | ICD-10-CM | POA: Diagnosis not present

## 2019-02-10 DIAGNOSIS — M4722 Other spondylosis with radiculopathy, cervical region: Secondary | ICD-10-CM | POA: Diagnosis not present

## 2019-02-11 DIAGNOSIS — M4722 Other spondylosis with radiculopathy, cervical region: Secondary | ICD-10-CM | POA: Diagnosis not present

## 2019-02-11 DIAGNOSIS — M25512 Pain in left shoulder: Secondary | ICD-10-CM | POA: Diagnosis not present

## 2019-02-11 DIAGNOSIS — M542 Cervicalgia: Secondary | ICD-10-CM | POA: Diagnosis not present

## 2019-02-12 DIAGNOSIS — C44722 Squamous cell carcinoma of skin of right lower limb, including hip: Secondary | ICD-10-CM | POA: Diagnosis not present

## 2019-02-15 DIAGNOSIS — M542 Cervicalgia: Secondary | ICD-10-CM | POA: Diagnosis not present

## 2019-02-15 DIAGNOSIS — M4722 Other spondylosis with radiculopathy, cervical region: Secondary | ICD-10-CM | POA: Diagnosis not present

## 2019-02-15 DIAGNOSIS — M25512 Pain in left shoulder: Secondary | ICD-10-CM | POA: Diagnosis not present

## 2019-02-17 DIAGNOSIS — Z23 Encounter for immunization: Secondary | ICD-10-CM | POA: Diagnosis not present

## 2019-02-22 DIAGNOSIS — M4722 Other spondylosis with radiculopathy, cervical region: Secondary | ICD-10-CM | POA: Diagnosis not present

## 2019-02-22 DIAGNOSIS — M542 Cervicalgia: Secondary | ICD-10-CM | POA: Diagnosis not present

## 2019-02-22 DIAGNOSIS — M25512 Pain in left shoulder: Secondary | ICD-10-CM | POA: Diagnosis not present

## 2019-02-23 DIAGNOSIS — C44722 Squamous cell carcinoma of skin of right lower limb, including hip: Secondary | ICD-10-CM | POA: Diagnosis not present

## 2019-02-24 ENCOUNTER — Telehealth: Payer: Medicare Other | Admitting: Cardiology

## 2019-03-03 ENCOUNTER — Telehealth: Payer: Medicare Other | Admitting: Cardiology

## 2019-03-11 DIAGNOSIS — M810 Age-related osteoporosis without current pathological fracture: Secondary | ICD-10-CM | POA: Diagnosis not present

## 2019-03-26 DIAGNOSIS — E785 Hyperlipidemia, unspecified: Secondary | ICD-10-CM | POA: Diagnosis not present

## 2019-03-26 DIAGNOSIS — D539 Nutritional anemia, unspecified: Secondary | ICD-10-CM | POA: Diagnosis not present

## 2019-03-26 DIAGNOSIS — I10 Essential (primary) hypertension: Secondary | ICD-10-CM | POA: Diagnosis not present

## 2019-03-26 DIAGNOSIS — E871 Hypo-osmolality and hyponatremia: Secondary | ICD-10-CM | POA: Diagnosis not present

## 2019-05-04 ENCOUNTER — Other Ambulatory Visit: Payer: Self-pay

## 2019-05-04 ENCOUNTER — Encounter: Payer: Self-pay | Admitting: Cardiology

## 2019-05-04 ENCOUNTER — Ambulatory Visit (INDEPENDENT_AMBULATORY_CARE_PROVIDER_SITE_OTHER): Payer: Medicare Other | Admitting: Cardiology

## 2019-05-04 VITALS — BP 130/68 | HR 63 | Ht 67.5 in | Wt 150.4 lb

## 2019-05-04 DIAGNOSIS — R002 Palpitations: Secondary | ICD-10-CM

## 2019-05-04 DIAGNOSIS — E781 Pure hyperglyceridemia: Secondary | ICD-10-CM | POA: Diagnosis not present

## 2019-05-04 DIAGNOSIS — I10 Essential (primary) hypertension: Secondary | ICD-10-CM

## 2019-05-04 NOTE — Progress Notes (Signed)
Cardiology Office Note:    Date:  05/04/2019   ID:  Veronica Maynard, DOB March 17, 1941, MRN [id number]  PCP:  Nicoletta Dress, MD  Cardiologist:  Jenne Campus, MD    Referring MD: Nicoletta Dress, MD   Chief Complaint  Patient presents with  . Follow-up  Doing very well  History of Present Illness:    Veronica Maynard is a 78 y.o. female with past medical history significant for essential hypertension, palpitations, dyslipidemia comes today to my office in follow-up.  Overall doing very well.  She when walking to the room she is reading a book.  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  Denies have any chest pain tightness squeezing pressure burning chest no palpitations no dizziness.  Past Medical History:  Diagnosis Date  . Arthritis   . Headache    due to hormone replacement  . Hypertension     Past Surgical History:  Procedure Laterality Date  . SQUAMOUS CELL CARCINOMA EXCISION    . TONSILLECTOMY     age 52  . TOTAL HIP ARTHROPLASTY Right 06/08/2014   Procedure: RIGHT TOTAL HIP ARTHROPLASTY ANTERIOR APPROACH;  Surgeon: Gearlean Alf, MD;  Location: [facility];  Service: Orthopedics;  Laterality: Right;  . TUBAL LIGATION      Current Medications: Current Meds  Medication Sig  . acetaminophen (TYLENOL) 500 MG tablet Take 1,000 mg by mouth every 6 (six) hours as needed for mild pain or moderate pain.  Marland Kitchen aspirin EC 81 MG tablet Take 81 mg by mouth 2 (two) times daily.  . fluticasone (FLONASE) 50 MCG/ACT nasal spray Place 2 sprays into both nostrils every evening.  . loratadine (CLARITIN) 10 MG tablet Take 10 mg by mouth daily.  . metoprolol succinate (TOPROL-XL) 50 MG 24 hr tablet Take 50 mg by mouth every evening.   . olmesartan (BENICAR) 40 MG tablet Take 1 tablet by mouth daily.  . pravastatin (PRAVACHOL) 20 MG tablet Take 20 mg by mouth daily.     Allergies:   Rivaroxaban   Social History   Socioeconomic History  . Marital status:  Married    Spouse name: Not on file  . Number of children: Not on file  . Years of education: Not on file  . Highest education level: Not on file  Occupational History  . Not on file  Tobacco Use  . Smoking status: Never Smoker  . Smokeless tobacco: Never Used  Substance and Sexual Activity  . Alcohol use: Yes    Comment: rarely  . Drug use: No  . Sexual activity: Not on file  Other Topics Concern  . Not on file  Social History Narrative  . Not on file   Social Determinants of Health   Financial Resource Strain:   . Difficulty of Paying Living Expenses: Not on file  Food Insecurity:   . Worried About Charity fundraiser in the Last Year: Not on file  . Ran Out of Food in the Last Year: Not on file  Transportation Needs:   . Lack of Transportation (Medical): Not on file  . Lack of Transportation (Non-Medical): Not on file  Physical Activity:   . Days of Exercise per Week: Not on file  . Minutes of Exercise per Session: Not on file  Stress:   . Feeling of Stress : Not on file  Social Connections:   . Frequency of Communication with Friends and Family: Not on file  . Frequency  of Social Gatherings with Friends and Family: Not on file  . Attends Religious Services: Not on file  . Active Member of Clubs or Organizations: Not on file  . Attends Archivist Meetings: Not on file  . Marital Status: Not on file     Family History: The patient's family history includes Heart disease in her father and mother. ROS:   Please see the history of present illness.    All 14 point review of systems negative except as described per history of present illness  EKGs/Labs/Other Studies Reviewed:      Recent Labs: No results found for requested labs within last 8760 hours.  Recent Lipid Panel No results found for: CHOL, TRIG, HDL, CHOLHDL, VLDL, LDLCALC, LDLDIRECT  Physical Exam:    VS:  BP 130/68   Pulse 63   Ht 5' 7.5" (1.715 m)   Wt 150 lb 6.4 oz (68.2 kg)   SpO2  99%   BMI 23.21 kg/m     Wt Readings from Last 3 Encounters:  05/04/19 150 lb 6.4 oz (68.2 kg)  04/03/18 155 lb 6.4 oz (70.5 kg)  03/11/17 150 lb 1.9 oz (68.1 kg)     GEN:  Well nourished, well developed in no acute distress HEENT: Normal NECK: No JVD; No carotid bruits LYMPHATICS: No lymphadenopathy CARDIAC: RRR, no murmurs, no rubs, no gallops RESPIRATORY:  Clear to auscultation without rales, wheezing or rhonchi  ABDOMEN: Soft, non-tender, non-distended MUSCULOSKELETAL:  No edema; No deformity  SKIN: Warm and dry LOWER EXTREMITIES: no swelling NEUROLOGIC:  Alert and oriented x 3 PSYCHIATRIC:  Normal affect   ASSESSMENT:    1. Essential hypertension   2. Palpitations   3. Pure hypertriglyceridemia    PLAN:    In order of problems listed above:  1. Essential hypertension blood pressure appears to be well controlled continue present management. 2. Palpitations denies having any doing well from that point review 3. Dyslipidemia her LDL is 101.  However at the same time her HDL is always more than 70.  She is on pravastatin which I will continue.  Overall she is doing well.   Medication Adjustments/Labs and Tests Ordered: Current medicines are reviewed at length with the patient today.  Concerns regarding medicines are outlined above.  No orders of the defined types were placed in this encounter.  Medication changes: No orders of the defined types were placed in this encounter.   Signed, Park Liter, MD, [facility] 05/04/2019 9:05 AM    [place]

## 2019-05-04 NOTE — Patient Instructions (Signed)

## 2019-06-28 DIAGNOSIS — C44722 Squamous cell carcinoma of skin of right lower limb, including hip: Secondary | ICD-10-CM | POA: Diagnosis not present

## 2019-06-28 DIAGNOSIS — I10 Essential (primary) hypertension: Secondary | ICD-10-CM | POA: Diagnosis not present

## 2019-06-28 DIAGNOSIS — E559 Vitamin D deficiency, unspecified: Secondary | ICD-10-CM | POA: Diagnosis not present

## 2019-06-28 DIAGNOSIS — L820 Inflamed seborrheic keratosis: Secondary | ICD-10-CM | POA: Diagnosis not present

## 2019-06-28 DIAGNOSIS — E785 Hyperlipidemia, unspecified: Secondary | ICD-10-CM | POA: Diagnosis not present

## 2019-06-28 DIAGNOSIS — Z6823 Body mass index (BMI) 23.0-23.9, adult: Secondary | ICD-10-CM | POA: Diagnosis not present

## 2019-06-28 DIAGNOSIS — D539 Nutritional anemia, unspecified: Secondary | ICD-10-CM | POA: Diagnosis not present

## 2019-08-05 DIAGNOSIS — E785 Hyperlipidemia, unspecified: Secondary | ICD-10-CM | POA: Diagnosis not present

## 2019-08-05 DIAGNOSIS — Z9181 History of falling: Secondary | ICD-10-CM | POA: Diagnosis not present

## 2019-08-05 DIAGNOSIS — Z0000 Encounter for general adult medical examination without abnormal findings: Secondary | ICD-10-CM | POA: Diagnosis not present

## 2019-08-05 DIAGNOSIS — Z1231 Encounter for screening mammogram for malignant neoplasm of breast: Secondary | ICD-10-CM | POA: Diagnosis not present

## 2019-08-05 DIAGNOSIS — Z1331 Encounter for screening for depression: Secondary | ICD-10-CM | POA: Diagnosis not present

## 2019-08-05 DIAGNOSIS — Z136 Encounter for screening for cardiovascular disorders: Secondary | ICD-10-CM | POA: Diagnosis not present

## 2019-09-09 DIAGNOSIS — M810 Age-related osteoporosis without current pathological fracture: Secondary | ICD-10-CM | POA: Diagnosis not present

## 2019-10-01 DIAGNOSIS — D539 Nutritional anemia, unspecified: Secondary | ICD-10-CM | POA: Diagnosis not present

## 2019-10-01 DIAGNOSIS — I10 Essential (primary) hypertension: Secondary | ICD-10-CM | POA: Diagnosis not present

## 2019-10-01 DIAGNOSIS — E785 Hyperlipidemia, unspecified: Secondary | ICD-10-CM | POA: Diagnosis not present

## 2019-10-01 DIAGNOSIS — Z6823 Body mass index (BMI) 23.0-23.9, adult: Secondary | ICD-10-CM | POA: Diagnosis not present

## 2019-10-15 DIAGNOSIS — Z6823 Body mass index (BMI) 23.0-23.9, adult: Secondary | ICD-10-CM | POA: Diagnosis not present

## 2019-10-15 DIAGNOSIS — I10 Essential (primary) hypertension: Secondary | ICD-10-CM | POA: Diagnosis not present

## 2019-10-25 DIAGNOSIS — H52221 Regular astigmatism, right eye: Secondary | ICD-10-CM | POA: Diagnosis not present

## 2019-10-25 DIAGNOSIS — H524 Presbyopia: Secondary | ICD-10-CM | POA: Diagnosis not present

## 2019-10-25 DIAGNOSIS — H02051 Trichiasis without entropian right upper eyelid: Secondary | ICD-10-CM | POA: Diagnosis not present

## 2019-10-25 DIAGNOSIS — H35371 Puckering of macula, right eye: Secondary | ICD-10-CM | POA: Diagnosis not present

## 2019-10-25 DIAGNOSIS — H02052 Trichiasis without entropian right lower eyelid: Secondary | ICD-10-CM | POA: Diagnosis not present

## 2019-10-25 DIAGNOSIS — Z961 Presence of intraocular lens: Secondary | ICD-10-CM | POA: Diagnosis not present

## 2019-10-25 DIAGNOSIS — H35341 Macular cyst, hole, or pseudohole, right eye: Secondary | ICD-10-CM | POA: Diagnosis not present

## 2019-10-25 DIAGNOSIS — H5201 Hypermetropia, right eye: Secondary | ICD-10-CM | POA: Diagnosis not present

## 2019-10-25 DIAGNOSIS — Z9849 Cataract extraction status, unspecified eye: Secondary | ICD-10-CM | POA: Diagnosis not present

## 2019-11-15 ENCOUNTER — Ambulatory Visit (INDEPENDENT_AMBULATORY_CARE_PROVIDER_SITE_OTHER): Payer: Medicare Other | Admitting: Cardiology

## 2019-11-15 ENCOUNTER — Encounter: Payer: Self-pay | Admitting: Cardiology

## 2019-11-15 ENCOUNTER — Other Ambulatory Visit: Payer: Self-pay

## 2019-11-15 VITALS — BP 132/60 | HR 73 | Ht 67.5 in | Wt 148.0 lb

## 2019-11-15 DIAGNOSIS — I10 Essential (primary) hypertension: Secondary | ICD-10-CM

## 2019-11-15 DIAGNOSIS — R002 Palpitations: Secondary | ICD-10-CM | POA: Diagnosis not present

## 2019-11-15 DIAGNOSIS — E781 Pure hyperglyceridemia: Secondary | ICD-10-CM | POA: Diagnosis not present

## 2019-11-15 NOTE — Progress Notes (Signed)
Cardiology Office Note:    Date:  11/15/2019   ID:  Veronica Maynard, DOB 1940/11/13, MRN [id number]  PCP:  Nicoletta Dress, MD  Cardiologist:  Jenne Campus, MD    Referring MD: Nicoletta Dress, MD   No chief complaint on file. Doing well  History of Present Illness:    Veronica Maynard is a 79 y.o. female with past medical history significant for essential hypertension, hypertriglyceridemia, osteoarthritis comes today to my office to follow-up overall doing great.  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  EKG done today showed normal sinus rhythm without any abnormalities.  Denies having any palpitations.  Concern is about her blood pressure which seems to be somewhat difficult to control she check her blood pressure on the regular basis there was a time that she noted some numbers more than 324 systolic for few days and then advice was given to start amlodipine 2.5 daily since that time she does have some situation with her blood pressure is low.  Today I told her to discontinue amlodipine.  I also asked her to check blood pressure on a regular basis let me know how things are.  Past Medical History:  Diagnosis Date  . Arthritis   . Headache    due to hormone replacement  . Hypertension     Past Surgical History:  Procedure Laterality Date  . SQUAMOUS CELL CARCINOMA EXCISION    . TONSILLECTOMY     age 22  . TOTAL HIP ARTHROPLASTY Right 06/08/2014   Procedure: RIGHT TOTAL HIP ARTHROPLASTY ANTERIOR APPROACH;  Surgeon: Gearlean Alf, MD;  Location: [facility];  Service: Orthopedics;  Laterality: Right;  . TUBAL LIGATION      Current Medications: Current Meds  Medication Sig  . acetaminophen (TYLENOL) 500 MG tablet Take 1,000 mg by mouth every 6 (six) hours as needed for mild pain or moderate pain.  Marland Kitchen amLODipine (NORVASC) 2.5 MG tablet Take 2.5 mg by  mouth daily.  Marland Kitchen aspirin EC 81 MG tablet Take 81 mg by mouth 2 (two) times daily.  . fluticasone (FLONASE) 50 MCG/ACT nasal spray Place 2 sprays into both nostrils every evening.  . loratadine (CLARITIN) 10 MG tablet Take 10 mg by mouth daily.  . metoprolol succinate (TOPROL-XL) 50 MG 24 hr tablet Take 50 mg by mouth every evening.   . olmesartan (BENICAR) 40 MG tablet Take 1 tablet by mouth daily.  . pravastatin (PRAVACHOL) 20 MG tablet Take 20 mg by mouth daily.     Allergies:   Rivaroxaban   Social History   Socioeconomic History  . Marital status: Married    Spouse name: Not on file  . Number of children: Not on file  . Years of education: Not on file  . Highest education level: Not on file  Occupational History  . Not on file  Tobacco Use  . Smoking status: Never Smoker  . Smokeless tobacco: Never Used  Vaping Use  . Vaping Use: Never used  Substance and Sexual Activity  . Alcohol use: Yes    Comment: rarely  . Drug use: No  . Sexual activity: Not on file  Other Topics Concern  . Not on file  Social History Narrative  . Not on file   Social Determinants of Health   Financial Resource Strain:   . Difficulty of  Paying Living Expenses:   Food Insecurity:   . Worried About Charity fundraiser in the Last Year:   . Arboriculturist in the Last Year:   Transportation Needs:   . Film/video editor (Medical):   Marland Kitchen Lack of Transportation (Non-Medical):   Physical Activity:   . Days of Exercise per Week:   . Minutes of Exercise per Session:   Stress:   . Feeling of Stress :   Social Connections:   . Frequency of Communication with Friends and Family:   . Frequency of Social Gatherings with Friends and Family:   . Attends Religious Services:   . Active Member of Clubs or Organizations:   . Attends Archivist Meetings:   Marland Kitchen Marital Status:      Family History: The patient's family history includes Heart disease in her father and mother. ROS:   Please  see the history of present illness.    All 14 point review of systems negative except as described per history of present illness  EKGs/Labs/Other Studies Reviewed:      Recent Labs: No results found for requested labs within last 8760 hours.  Recent Lipid Panel No results found for: CHOL, TRIG, HDL, CHOLHDL, VLDL, LDLCALC, LDLDIRECT  Physical Exam:    VS:  BP 132/60 (BP Location: Left Arm, Patient Position: Sitting, Cuff Size: Normal)   Pulse 73   Ht 5' 7.5" (1.715 m)   Wt 148 lb (67.1 kg)   SpO2 98%   BMI 22.84 kg/m     Wt Readings from Last 3 Encounters:  11/15/19 148 lb (67.1 kg)  05/04/19 150 lb 6.4 oz (68.2 kg)  04/03/18 155 lb 6.4 oz (70.5 kg)     GEN:  Well nourished, well developed in no acute distress HEENT: Normal NECK: No JVD; No carotid bruits LYMPHATICS: No lymphadenopathy CARDIAC: RRR, no murmurs, no rubs, no gallops RESPIRATORY:  Clear to auscultation without rales, wheezing or rhonchi  ABDOMEN: Soft, non-tender, non-distended MUSCULOSKELETAL:  No edema; No deformity  SKIN: Warm and dry LOWER EXTREMITIES: no swelling NEUROLOGIC:  Alert and oriented x 3 PSYCHIATRIC:  Normal affect   ASSESSMENT:    1. Essential hypertension   2. Palpitations   3. Pure hypertriglyceridemia    PLAN:    In order of problems listed above:  1. Essential hypertension still uncontrolled.  Now asked her to stop amlodipine and keep checking her blood pressure and bring results to me in about 2 to 3 weeks.  We will continue with Toprol-XL. 2. Palpitations: Denies having any on Toprol-XL which I will continue. 3. Dyslipidemia I did review her K PN her HDL is 87 LDL 73 this is from 10/01/2019.  Excellent cholesterol profile we will continue present management.   Medication Adjustments/Labs and Tests Ordered: Current medicines are reviewed at length with the patient today.  Concerns regarding medicines are outlined above.  No orders of the defined types were placed in this  encounter.  Medication changes: No orders of the defined types were placed in this encounter.   Signed, Park Liter, MD, [facility] 11/15/2019 10:30 AM    [place]

## 2019-11-15 NOTE — Patient Instructions (Signed)
Medication Instructions:  None  *If you need a refill on your cardiac medications before your next appointment, please call your pharmacy*   Lab Work: None  If you have labs (blood work) drawn today and your tests are completely normal, you will receive your results only by: Marland Kitchen MyChart Message (if you have MyChart) OR . A paper copy in the mail If you have any lab test that is abnormal or we need to change your treatment, we will call you to review the results.   Testing/Procedures: None   Follow-Up: At [facility], you and your health needs are our priority.  As part of our continuing mission to provide you with exceptional heart care, we have created designated Provider Care Teams.  These Care Teams include your primary Cardiologist (physician) and Advanced Practice Providers (APPs -  Physician Assistants and Nurse Practitioners) who all work together to provide you with the care you need, when you need it.  We recommend signing up for the patient portal called "MyChart".  Sign up information is provided on this After Visit Summary.  MyChart is used to connect with patients for Virtual Visits (Telemedicine).  Patients are able to view lab/test results, encounter notes, upcoming appointments, etc.  Non-urgent messages can be sent to your provider as well.   To learn more about what you can do with MyChart, go to [email or web address].    Your next appointment:   5 month(s)  The format for your next appointment:   In Person  Provider:   Jenne Campus, MD   Other Instructions

## 2020-01-10 DIAGNOSIS — Z1231 Encounter for screening mammogram for malignant neoplasm of breast: Secondary | ICD-10-CM | POA: Diagnosis not present

## 2020-01-11 DIAGNOSIS — Z6822 Body mass index (BMI) 22.0-22.9, adult: Secondary | ICD-10-CM | POA: Diagnosis not present

## 2020-01-11 DIAGNOSIS — I10 Essential (primary) hypertension: Secondary | ICD-10-CM | POA: Diagnosis not present

## 2020-01-11 DIAGNOSIS — D539 Nutritional anemia, unspecified: Secondary | ICD-10-CM | POA: Diagnosis not present

## 2020-01-11 DIAGNOSIS — I471 Supraventricular tachycardia: Secondary | ICD-10-CM | POA: Diagnosis not present

## 2020-01-11 DIAGNOSIS — E875 Hyperkalemia: Secondary | ICD-10-CM | POA: Diagnosis not present

## 2020-01-11 DIAGNOSIS — E785 Hyperlipidemia, unspecified: Secondary | ICD-10-CM | POA: Diagnosis not present

## 2020-01-11 DIAGNOSIS — M810 Age-related osteoporosis without current pathological fracture: Secondary | ICD-10-CM | POA: Diagnosis not present

## 2020-02-15 DIAGNOSIS — Z23 Encounter for immunization: Secondary | ICD-10-CM | POA: Diagnosis not present

## 2020-02-28 DIAGNOSIS — Z23 Encounter for immunization: Secondary | ICD-10-CM | POA: Diagnosis not present

## 2020-03-14 DIAGNOSIS — M810 Age-related osteoporosis without current pathological fracture: Secondary | ICD-10-CM | POA: Diagnosis not present

## 2020-04-17 DIAGNOSIS — M1990 Unspecified osteoarthritis, unspecified site: Secondary | ICD-10-CM | POA: Insufficient documentation

## 2020-04-17 DIAGNOSIS — I10 Essential (primary) hypertension: Secondary | ICD-10-CM | POA: Insufficient documentation

## 2020-04-17 DIAGNOSIS — R519 Headache, unspecified: Secondary | ICD-10-CM | POA: Insufficient documentation

## 2020-04-19 ENCOUNTER — Other Ambulatory Visit: Payer: Self-pay

## 2020-04-19 ENCOUNTER — Encounter: Payer: Self-pay | Admitting: Cardiology

## 2020-04-19 ENCOUNTER — Ambulatory Visit (INDEPENDENT_AMBULATORY_CARE_PROVIDER_SITE_OTHER): Payer: Medicare Other | Admitting: Cardiology

## 2020-04-19 VITALS — BP 138/76 | HR 75 | Ht 67.5 in | Wt 150.0 lb

## 2020-04-19 DIAGNOSIS — R002 Palpitations: Secondary | ICD-10-CM

## 2020-04-19 DIAGNOSIS — I10 Essential (primary) hypertension: Secondary | ICD-10-CM

## 2020-04-19 DIAGNOSIS — E781 Pure hyperglyceridemia: Secondary | ICD-10-CM

## 2020-04-19 NOTE — Patient Instructions (Signed)

## 2020-04-19 NOTE — Progress Notes (Signed)
Cardiology Office Note:    Date:  04/19/2020   ID:  RALYNN SAN, DOB [birth year], MRN [id number]  PCP:  Nicoletta Dress, MD  Cardiologist:  Jenne Campus, MD    Referring MD: Nicoletta Dress, MD   Chief Complaint  Patient presents with  . Follow-up  Doing very well  History of Present Illness:    Veronica Maynard is a 79 y.o. female with past medical history significant for essential hypertension, palpitation, dyslipidemia.  Comes today to my office for follow-up overall she is doing great.  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  Denies have any shortness of breath chest pain tightness squeezing pressure burning chest.  Overall she is doing very well and have to meet for someone who is 79 years old she is doing Fish farm manager well  Past Medical History:  Diagnosis Date  . Arthritis   . Essential hypertension 07/04/2016  . Headache    due to hormone replacement  . Hypertension   . OA (osteoarthritis) of hip 06/08/2014  . Palpitations 07/04/2016  . Pure hypertriglyceridemia 07/04/2016    Past Surgical History:  Procedure Laterality Date  . SQUAMOUS CELL CARCINOMA EXCISION    . TONSILLECTOMY     age 37  . TOTAL HIP ARTHROPLASTY Right 06/08/2014   Procedure: RIGHT TOTAL HIP ARTHROPLASTY ANTERIOR APPROACH;  Surgeon: Gearlean Alf, MD;  Location: [facility];  Service: Orthopedics;  Laterality: Right;  . TUBAL LIGATION      Current Medications: Current Meds  Medication Sig  . aspirin EC 81 MG tablet Take 81 mg by mouth 2 (two) times daily.  . fluticasone (FLONASE) 50 MCG/ACT nasal spray Place 2 sprays into both nostrils every evening.  . loratadine (CLARITIN) 10 MG tablet Take 10 mg by mouth daily.  . metoprolol succinate (TOPROL-XL) 50 MG 24 hr tablet Take 50 mg by mouth every evening.   . olmesartan (BENICAR) 40 MG tablet Take 1 tablet by mouth daily.  .  pravastatin (PRAVACHOL) 20 MG tablet Take 20 mg by mouth daily.     Allergies:   Rivaroxaban   Social History   Socioeconomic History  . Marital status: Married    Spouse name: Not on file  . Number of children: Not on file  . Years of education: Not on file  . Highest education level: Not on file  Occupational History  . Not on file  Tobacco Use  . Smoking status: Never Smoker  . Smokeless tobacco: Never Used  Vaping Use  . Vaping Use: Never used  Substance and Sexual Activity  . Alcohol use: Yes    Comment: rarely  . Drug use: No  . Sexual activity: Not on file  Other Topics Concern  . Not on file  Social History Narrative  . Not on file   Social Determinants of Health   Financial Resource Strain:   . Difficulty of Paying Living Expenses: Not on file  Food Insecurity:   . Worried About Charity fundraiser in the Last Year: Not on file  . Ran Out of Food in the Last Year: Not on file  Transportation Needs:   . Lack of Transportation (Medical): Not on file  . Lack of Transportation (Non-Medical): Not on file  Physical Activity:   . Days of Exercise per Week: Not on file  .  Minutes of Exercise per Session: Not on file  Stress:   . Feeling of Stress : Not on file  Social Connections:   . Frequency of Communication with Friends and Family: Not on file  . Frequency of Social Gatherings with Friends and Family: Not on file  . Attends Religious Services: Not on file  . Active Member of Clubs or Organizations: Not on file  . Attends Archivist Meetings: Not on file  . Marital Status: Not on file     Family History: The patient's family history includes Heart disease in her father and mother. ROS:   Please see the history of present illness.    All 14 point review of systems negative except as described per history of present illness  EKGs/Labs/Other Studies Reviewed:      Recent Labs: No results found for requested labs within last 8760 hours.    Recent Lipid Panel No results found for: CHOL, TRIG, HDL, CHOLHDL, VLDL, LDLCALC, LDLDIRECT  Physical Exam:    VS:  BP 138/76 (BP Location: Right Arm, Patient Position: Standing)   Pulse 75   Ht 5' 7.5" (1.715 m)   Wt 150 lb (68 kg)   SpO2 98%   BMI 23.15 kg/m     Wt Readings from Last 3 Encounters:  04/19/20 150 lb (68 kg)  11/15/19 148 lb (67.1 kg)  05/04/19 150 lb 6.4 oz (68.2 kg)     GEN:  Well nourished, well developed in no acute distress HEENT: Normal NECK: No JVD; No carotid bruits LYMPHATICS: No lymphadenopathy CARDIAC: RRR, no murmurs, no rubs, no gallops RESPIRATORY:  Clear to auscultation without rales, wheezing or rhonchi  ABDOMEN: Soft, non-tender, non-distended MUSCULOSKELETAL:  No edema; No deformity  SKIN: Warm and dry LOWER EXTREMITIES: no swelling NEUROLOGIC:  Alert and oriented x 3 PSYCHIATRIC:  Normal affect   ASSESSMENT:    1. Essential hypertension   2. Palpitations   3. Pure hypertriglyceridemia    PLAN:    In order of problems listed above:  1. Essential hypertension she brought blood pressure measurements from home is always good.  We will continue present management. 2. Palpitations successfully managed with beta-blocker which I will continue. 3. Dyslipidemia: I do have her fasting lipid profile from primary care physician from 01/11/2020 showing LDL of 99 and HDL of 76.  But overall her risk is rather small, she is taking pravastatin 20 which I will continue.  Overall have told me she is doing amazingly well I encouraged her to keep reading books as well as keeping up with her business.   Medication Adjustments/Labs and Tests Ordered: Current medicines are reviewed at length with the patient today.  Concerns regarding medicines are outlined above.  No orders of the defined types were placed in this encounter.  Medication changes: No orders of the defined types were placed in this encounter.   Signed, Park Liter, MD,  [facility] 04/19/2020 1:31 PM    [place]

## 2020-04-21 DIAGNOSIS — M1990 Unspecified osteoarthritis, unspecified site: Secondary | ICD-10-CM | POA: Diagnosis not present

## 2020-04-21 DIAGNOSIS — E785 Hyperlipidemia, unspecified: Secondary | ICD-10-CM | POA: Diagnosis not present

## 2020-04-21 DIAGNOSIS — I471 Supraventricular tachycardia: Secondary | ICD-10-CM | POA: Diagnosis not present

## 2020-04-21 DIAGNOSIS — I10 Essential (primary) hypertension: Secondary | ICD-10-CM | POA: Diagnosis not present

## 2020-04-21 DIAGNOSIS — M810 Age-related osteoporosis without current pathological fracture: Secondary | ICD-10-CM | POA: Diagnosis not present

## 2020-04-21 DIAGNOSIS — Z139 Encounter for screening, unspecified: Secondary | ICD-10-CM | POA: Diagnosis not present

## 2020-04-21 DIAGNOSIS — D539 Nutritional anemia, unspecified: Secondary | ICD-10-CM | POA: Diagnosis not present

## 2020-04-21 DIAGNOSIS — Z6822 Body mass index (BMI) 22.0-22.9, adult: Secondary | ICD-10-CM | POA: Diagnosis not present

## 2020-04-21 DIAGNOSIS — E875 Hyperkalemia: Secondary | ICD-10-CM | POA: Diagnosis not present

## 2020-05-05 DIAGNOSIS — E875 Hyperkalemia: Secondary | ICD-10-CM | POA: Diagnosis not present

## 2020-06-27 DIAGNOSIS — L578 Other skin changes due to chronic exposure to nonionizing radiation: Secondary | ICD-10-CM | POA: Diagnosis not present

## 2020-06-27 DIAGNOSIS — L570 Actinic keratosis: Secondary | ICD-10-CM | POA: Diagnosis not present

## 2020-06-27 DIAGNOSIS — K130 Diseases of lips: Secondary | ICD-10-CM | POA: Diagnosis not present

## 2020-06-27 DIAGNOSIS — L821 Other seborrheic keratosis: Secondary | ICD-10-CM | POA: Diagnosis not present

## 2020-07-21 DIAGNOSIS — M810 Age-related osteoporosis without current pathological fracture: Secondary | ICD-10-CM | POA: Diagnosis not present

## 2020-07-21 DIAGNOSIS — R739 Hyperglycemia, unspecified: Secondary | ICD-10-CM | POA: Diagnosis not present

## 2020-07-21 DIAGNOSIS — E785 Hyperlipidemia, unspecified: Secondary | ICD-10-CM | POA: Diagnosis not present

## 2020-07-21 DIAGNOSIS — E875 Hyperkalemia: Secondary | ICD-10-CM | POA: Diagnosis not present

## 2020-07-21 DIAGNOSIS — M1990 Unspecified osteoarthritis, unspecified site: Secondary | ICD-10-CM | POA: Diagnosis not present

## 2020-07-21 DIAGNOSIS — M25532 Pain in left wrist: Secondary | ICD-10-CM | POA: Diagnosis not present

## 2020-07-21 DIAGNOSIS — I10 Essential (primary) hypertension: Secondary | ICD-10-CM | POA: Diagnosis not present

## 2020-07-21 DIAGNOSIS — D539 Nutritional anemia, unspecified: Secondary | ICD-10-CM | POA: Diagnosis not present

## 2020-07-21 DIAGNOSIS — I471 Supraventricular tachycardia: Secondary | ICD-10-CM | POA: Diagnosis not present

## 2020-07-21 DIAGNOSIS — Z6822 Body mass index (BMI) 22.0-22.9, adult: Secondary | ICD-10-CM | POA: Diagnosis not present

## 2020-08-14 DIAGNOSIS — M654 Radial styloid tenosynovitis [de Quervain]: Secondary | ICD-10-CM | POA: Insufficient documentation

## 2020-08-14 DIAGNOSIS — M13842 Other specified arthritis, left hand: Secondary | ICD-10-CM | POA: Diagnosis not present

## 2020-08-15 DIAGNOSIS — Z6823 Body mass index (BMI) 23.0-23.9, adult: Secondary | ICD-10-CM | POA: Diagnosis not present

## 2020-08-15 DIAGNOSIS — I10 Essential (primary) hypertension: Secondary | ICD-10-CM | POA: Diagnosis not present

## 2020-08-18 DIAGNOSIS — Z96641 Presence of right artificial hip joint: Secondary | ICD-10-CM | POA: Diagnosis not present

## 2020-08-22 DIAGNOSIS — Z23 Encounter for immunization: Secondary | ICD-10-CM | POA: Diagnosis not present

## 2020-08-24 DIAGNOSIS — E785 Hyperlipidemia, unspecified: Secondary | ICD-10-CM | POA: Diagnosis not present

## 2020-08-24 DIAGNOSIS — Z1331 Encounter for screening for depression: Secondary | ICD-10-CM | POA: Diagnosis not present

## 2020-08-24 DIAGNOSIS — Z0000 Encounter for general adult medical examination without abnormal findings: Secondary | ICD-10-CM | POA: Diagnosis not present

## 2020-08-24 DIAGNOSIS — Z9181 History of falling: Secondary | ICD-10-CM | POA: Diagnosis not present

## 2020-09-13 DIAGNOSIS — M654 Radial styloid tenosynovitis [de Quervain]: Secondary | ICD-10-CM | POA: Diagnosis not present

## 2020-09-13 DIAGNOSIS — M25532 Pain in left wrist: Secondary | ICD-10-CM | POA: Diagnosis not present

## 2020-09-18 DIAGNOSIS — I10 Essential (primary) hypertension: Secondary | ICD-10-CM | POA: Diagnosis not present

## 2020-09-18 DIAGNOSIS — M810 Age-related osteoporosis without current pathological fracture: Secondary | ICD-10-CM | POA: Diagnosis not present

## 2020-09-18 DIAGNOSIS — Z6822 Body mass index (BMI) 22.0-22.9, adult: Secondary | ICD-10-CM | POA: Diagnosis not present

## 2020-09-25 DIAGNOSIS — E871 Hypo-osmolality and hyponatremia: Secondary | ICD-10-CM | POA: Diagnosis not present

## 2020-09-25 DIAGNOSIS — I10 Essential (primary) hypertension: Secondary | ICD-10-CM | POA: Diagnosis not present

## 2020-10-13 DIAGNOSIS — I10 Essential (primary) hypertension: Secondary | ICD-10-CM | POA: Diagnosis not present

## 2020-10-13 DIAGNOSIS — E871 Hypo-osmolality and hyponatremia: Secondary | ICD-10-CM | POA: Diagnosis not present

## 2020-10-18 ENCOUNTER — Ambulatory Visit: Payer: Medicare Other | Admitting: Cardiology

## 2020-10-24 ENCOUNTER — Ambulatory Visit: Payer: Medicare Other | Admitting: Cardiology

## 2020-11-07 ENCOUNTER — Other Ambulatory Visit: Payer: Self-pay

## 2020-11-08 ENCOUNTER — Encounter: Payer: Self-pay | Admitting: Cardiology

## 2020-11-08 ENCOUNTER — Ambulatory Visit (INDEPENDENT_AMBULATORY_CARE_PROVIDER_SITE_OTHER): Payer: Medicare Other | Admitting: Cardiology

## 2020-11-08 ENCOUNTER — Other Ambulatory Visit: Payer: Self-pay

## 2020-11-08 VITALS — BP 160/76 | HR 87 | Ht 67.5 in | Wt 150.0 lb

## 2020-11-08 DIAGNOSIS — R002 Palpitations: Secondary | ICD-10-CM | POA: Diagnosis not present

## 2020-11-08 DIAGNOSIS — I10 Essential (primary) hypertension: Secondary | ICD-10-CM | POA: Diagnosis not present

## 2020-11-08 DIAGNOSIS — E781 Pure hyperglyceridemia: Secondary | ICD-10-CM | POA: Diagnosis not present

## 2020-11-08 NOTE — Progress Notes (Signed)
Cardiology Office Note:    Date:  11/08/2020   ID:  [name], DOB 08/25/1940, MRN [id number]  PCP:  Nicoletta Dress, MD  Cardiologist:  Jenne Campus, MD    Referring MD: Nicoletta Dress, MD   Chief Complaint  Patient presents with   Follow-up  Very well  History of Present Illness:    Veronica Maynard is a 80 y.o. female with past medical history significant for essential hypertension, palpitations, osteoarthritis.  She comes today to my office for follow-up.  There was significant changes in her medications.  Previously she was on ARB however suffering from hyperkalemia she was given some diuretic instead she had an event she was still having difficulty with sodium now she is being put on beta-blocker as well as amlodipine 2.5 mg daily her blood pressure is mildly elevated today but she brought blood pressure measurements from home it is always good.  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  No chest pain tightness squeezing pressure burning chest  Past Medical History:  Diagnosis Date   Arthritis    Essential hypertension 07/04/2016   Headache    due to hormone replacement   Hypertension    OA (osteoarthritis) of hip 06/08/2014   Palpitations 07/04/2016   Pure hypertriglyceridemia 07/04/2016    Past Surgical History:  Procedure Laterality Date   SQUAMOUS CELL CARCINOMA EXCISION     TONSILLECTOMY     age 2   TOTAL HIP ARTHROPLASTY Right 06/08/2014   Procedure: RIGHT TOTAL HIP ARTHROPLASTY ANTERIOR APPROACH;  Surgeon: Gearlean Alf, MD;  Location: [facility];  Service: Orthopedics;  Laterality: Right;   TUBAL LIGATION      Current Medications: Current Meds  Medication Sig   amLODipine (NORVASC) 2.5 MG tablet Take 2.5 mg by mouth daily.   aspirin EC 81 MG tablet Take 81 mg by mouth daily.   denosumab (PROLIA) 60 MG/ML SOSY injection Inject 60 mg into the skin  every 6 (six) months.   fluticasone (FLONASE) 50 MCG/ACT nasal spray Place 2 sprays into both nostrils every evening.   loratadine (CLARITIN) 10 MG tablet Take 10 mg by mouth daily.   metoprolol succinate (TOPROL-XL) 50 MG 24 hr tablet Take 50 mg by mouth every evening.    pravastatin (PRAVACHOL) 20 MG tablet Take 20 mg by mouth daily.     Allergies:   Rivaroxaban   Social History   Socioeconomic History   Marital status: Married    Spouse name: Not on file   Number of children: Not on file   Years of education: Not on file   Highest education level: Not on file  Occupational History   Not on file  Tobacco Use   Smoking status: Never   Smokeless tobacco: Never  Vaping Use   Vaping Use: Never used  Substance and Sexual Activity   Alcohol use: Yes    Comment: rarely   Drug use: No   Sexual activity: Not on file  Other Topics Concern   Not on file  Social History Narrative   Not on file   Social Determinants of Health   Financial Resource Strain: Not on file  Food Insecurity: Not on file  Transportation Needs: Not on file  Physical Activity: Not on file  Stress: Not on file  Social Connections: Not on file     Family History: The  patient's family history includes Heart disease in her father and mother. ROS:   Please see the history of present illness.    All 14 point review of systems negative except as described per history of present illness  EKGs/Labs/Other Studies Reviewed:      Recent Labs: No results found for requested labs within last 8760 hours.  Recent Lipid Panel No results found for: CHOL, TRIG, HDL, CHOLHDL, VLDL, LDLCALC, LDLDIRECT  Physical Exam:    VS:  BP (!) 160/76 (BP Location: Right Arm, Patient Position: Sitting)   Pulse 87   Ht 5' 7.5" (1.715 m)   Wt 150 lb (68 kg)   SpO2 96%   BMI 23.15 kg/m     Wt Readings from Last 3 Encounters:  11/08/20 150 lb (68 kg)  04/19/20 150 lb (68 kg)  11/15/19 148 lb (67.1 kg)     GEN:  Well  nourished, well developed in no acute distress HEENT: Normal NECK: No JVD; No carotid bruits LYMPHATICS: No lymphadenopathy CARDIAC: RRR, no murmurs, no rubs, no gallops RESPIRATORY:  Clear to auscultation without rales, wheezing or rhonchi  ABDOMEN: Soft, non-tender, non-distended MUSCULOSKELETAL:  No edema; No deformity  SKIN: Warm and dry LOWER EXTREMITIES: no swelling NEUROLOGIC:  Alert and oriented x 3 PSYCHIATRIC:  Normal affect   ASSESSMENT:    1. Essential hypertension   2. Palpitations   3. Pure hypertriglyceridemia    PLAN:    In order of problems listed above:  Essential hypertension even though elevated today in the office she showed me blood pressure measurements from home always good we will continue present medications.  If blood pressure becomes a problem we can go up with amlodipine from 2.5 to 5 mg daily Palpitations denies having any she is on beta-blocker which I will continue. Dyslipidemia I will call primary care physician to get her fasting lipid profile.   Medication Adjustments/Labs and Tests Ordered: Current medicines are reviewed at length with the patient today.  Concerns regarding medicines are outlined above.  Orders Placed This Encounter  Procedures   EKG 12-Lead   Medication changes: No orders of the defined types were placed in this encounter.   Signed, Park Liter, MD, [facility] 11/08/2020 3:26 PM    [place]

## 2020-11-08 NOTE — Patient Instructions (Signed)

## 2020-12-04 DIAGNOSIS — R739 Hyperglycemia, unspecified: Secondary | ICD-10-CM | POA: Diagnosis not present

## 2020-12-04 DIAGNOSIS — I10 Essential (primary) hypertension: Secondary | ICD-10-CM | POA: Diagnosis not present

## 2020-12-04 DIAGNOSIS — Z6823 Body mass index (BMI) 23.0-23.9, adult: Secondary | ICD-10-CM | POA: Diagnosis not present

## 2020-12-04 DIAGNOSIS — M1990 Unspecified osteoarthritis, unspecified site: Secondary | ICD-10-CM | POA: Diagnosis not present

## 2020-12-04 DIAGNOSIS — D539 Nutritional anemia, unspecified: Secondary | ICD-10-CM | POA: Diagnosis not present

## 2020-12-04 DIAGNOSIS — I471 Supraventricular tachycardia: Secondary | ICD-10-CM | POA: Diagnosis not present

## 2020-12-04 DIAGNOSIS — M25532 Pain in left wrist: Secondary | ICD-10-CM | POA: Diagnosis not present

## 2020-12-04 DIAGNOSIS — E785 Hyperlipidemia, unspecified: Secondary | ICD-10-CM | POA: Diagnosis not present

## 2020-12-04 DIAGNOSIS — E875 Hyperkalemia: Secondary | ICD-10-CM | POA: Diagnosis not present

## 2020-12-04 DIAGNOSIS — E871 Hypo-osmolality and hyponatremia: Secondary | ICD-10-CM | POA: Diagnosis not present

## 2020-12-04 DIAGNOSIS — M810 Age-related osteoporosis without current pathological fracture: Secondary | ICD-10-CM | POA: Diagnosis not present

## 2020-12-18 DIAGNOSIS — Z9849 Cataract extraction status, unspecified eye: Secondary | ICD-10-CM | POA: Diagnosis not present

## 2020-12-18 DIAGNOSIS — H524 Presbyopia: Secondary | ICD-10-CM | POA: Diagnosis not present

## 2020-12-18 DIAGNOSIS — H5203 Hypermetropia, bilateral: Secondary | ICD-10-CM | POA: Diagnosis not present

## 2020-12-18 DIAGNOSIS — H35371 Puckering of macula, right eye: Secondary | ICD-10-CM | POA: Diagnosis not present

## 2020-12-18 DIAGNOSIS — H52223 Regular astigmatism, bilateral: Secondary | ICD-10-CM | POA: Diagnosis not present

## 2020-12-18 DIAGNOSIS — Z961 Presence of intraocular lens: Secondary | ICD-10-CM | POA: Diagnosis not present

## 2020-12-18 DIAGNOSIS — I10 Essential (primary) hypertension: Secondary | ICD-10-CM | POA: Diagnosis not present

## 2020-12-18 DIAGNOSIS — H35341 Macular cyst, hole, or pseudohole, right eye: Secondary | ICD-10-CM | POA: Diagnosis not present

## 2020-12-21 DIAGNOSIS — L659 Nonscarring hair loss, unspecified: Secondary | ICD-10-CM | POA: Diagnosis not present

## 2020-12-21 DIAGNOSIS — I10 Essential (primary) hypertension: Secondary | ICD-10-CM | POA: Diagnosis not present

## 2021-01-11 DIAGNOSIS — I10 Essential (primary) hypertension: Secondary | ICD-10-CM | POA: Diagnosis not present

## 2021-01-11 DIAGNOSIS — R42 Dizziness and giddiness: Secondary | ICD-10-CM | POA: Diagnosis not present

## 2021-01-11 DIAGNOSIS — Z6823 Body mass index (BMI) 23.0-23.9, adult: Secondary | ICD-10-CM | POA: Diagnosis not present

## 2021-01-26 DIAGNOSIS — N959 Unspecified menopausal and perimenopausal disorder: Secondary | ICD-10-CM | POA: Diagnosis not present

## 2021-01-26 DIAGNOSIS — M85852 Other specified disorders of bone density and structure, left thigh: Secondary | ICD-10-CM | POA: Diagnosis not present

## 2021-01-26 DIAGNOSIS — M8589 Other specified disorders of bone density and structure, multiple sites: Secondary | ICD-10-CM | POA: Diagnosis not present

## 2021-01-26 DIAGNOSIS — Z1231 Encounter for screening mammogram for malignant neoplasm of breast: Secondary | ICD-10-CM | POA: Diagnosis not present

## 2021-03-02 DIAGNOSIS — Z23 Encounter for immunization: Secondary | ICD-10-CM | POA: Diagnosis not present

## 2021-03-08 ENCOUNTER — Encounter: Payer: Self-pay | Admitting: Podiatry

## 2021-03-08 ENCOUNTER — Ambulatory Visit (INDEPENDENT_AMBULATORY_CARE_PROVIDER_SITE_OTHER): Payer: Medicare Other

## 2021-03-08 ENCOUNTER — Ambulatory Visit (INDEPENDENT_AMBULATORY_CARE_PROVIDER_SITE_OTHER): Payer: Medicare Other | Admitting: Podiatry

## 2021-03-08 ENCOUNTER — Other Ambulatory Visit: Payer: Self-pay

## 2021-03-08 DIAGNOSIS — I10 Essential (primary) hypertension: Secondary | ICD-10-CM | POA: Diagnosis not present

## 2021-03-08 DIAGNOSIS — M722 Plantar fascial fibromatosis: Secondary | ICD-10-CM

## 2021-03-08 DIAGNOSIS — M810 Age-related osteoporosis without current pathological fracture: Secondary | ICD-10-CM | POA: Diagnosis not present

## 2021-03-08 DIAGNOSIS — Z6823 Body mass index (BMI) 23.0-23.9, adult: Secondary | ICD-10-CM | POA: Diagnosis not present

## 2021-03-08 DIAGNOSIS — M1990 Unspecified osteoarthritis, unspecified site: Secondary | ICD-10-CM | POA: Diagnosis not present

## 2021-03-08 DIAGNOSIS — M2041 Other hammer toe(s) (acquired), right foot: Secondary | ICD-10-CM | POA: Diagnosis not present

## 2021-03-08 DIAGNOSIS — R739 Hyperglycemia, unspecified: Secondary | ICD-10-CM | POA: Diagnosis not present

## 2021-03-08 DIAGNOSIS — E875 Hyperkalemia: Secondary | ICD-10-CM | POA: Diagnosis not present

## 2021-03-08 DIAGNOSIS — L659 Nonscarring hair loss, unspecified: Secondary | ICD-10-CM | POA: Diagnosis not present

## 2021-03-08 DIAGNOSIS — E785 Hyperlipidemia, unspecified: Secondary | ICD-10-CM | POA: Diagnosis not present

## 2021-03-08 DIAGNOSIS — D539 Nutritional anemia, unspecified: Secondary | ICD-10-CM | POA: Diagnosis not present

## 2021-03-08 DIAGNOSIS — I471 Supraventricular tachycardia: Secondary | ICD-10-CM | POA: Diagnosis not present

## 2021-03-08 DIAGNOSIS — E871 Hypo-osmolality and hyponatremia: Secondary | ICD-10-CM | POA: Diagnosis not present

## 2021-03-08 DIAGNOSIS — Z23 Encounter for immunization: Secondary | ICD-10-CM | POA: Diagnosis not present

## 2021-03-15 NOTE — Progress Notes (Signed)
  Subjective:  Patient ID: Veronica Maynard, female    DOB: 08-Nov-1940,  MRN: [id number]  Chief Complaint  Patient presents with   Plantar Fasciitis    Heel pain   Nail Problem    Right big toenail is thick    80 y.o. female presents with the above complaint. History confirmed with patient.  States that the third fourth toes on the right foot as well as the heel hurt somewhat burning and throbbing denies diabetes denies numbness and tingling in the foot has tried stretching it out which has not helped.  Also complains that the right big toe has some thickness to the nail but it is not very painful  Objective:  Physical Exam: warm, good capillary refill, no trophic changes or ulcerative lesions, normal DP and PT pulses, and normal sensory exam. Right Foot: Pain all patient about the third fourth toe hammertoes, right great toenail, central heel pain with fat pad atrophy noted  No images are attached to the encounter.  Radiographs: X-ray of the right foot: no fracture, dislocation, swelling or degenerative changes noted Assessment:   1. Plantar fasciitis of right foot   2. Hammer toe of right foot      Plan:  Patient was evaluated and treated and all questions answered.  Hammertoe -XR reviewed with patient -Educated on etiology of deformity -Dispensed toe spacers  Heel pad secondary to fat pad atrophy -Discussed importance of well-padded shoes possible inserts  No follow-ups on file.

## 2021-03-26 DIAGNOSIS — C44319 Basal cell carcinoma of skin of other parts of face: Secondary | ICD-10-CM | POA: Diagnosis not present

## 2021-03-26 DIAGNOSIS — L570 Actinic keratosis: Secondary | ICD-10-CM | POA: Diagnosis not present

## 2021-03-26 DIAGNOSIS — L638 Other alopecia areata: Secondary | ICD-10-CM | POA: Diagnosis not present

## 2021-03-26 DIAGNOSIS — L578 Other skin changes due to chronic exposure to nonionizing radiation: Secondary | ICD-10-CM | POA: Diagnosis not present

## 2021-03-26 DIAGNOSIS — L821 Other seborrheic keratosis: Secondary | ICD-10-CM | POA: Diagnosis not present

## 2021-03-26 DIAGNOSIS — C44329 Squamous cell carcinoma of skin of other parts of face: Secondary | ICD-10-CM | POA: Diagnosis not present

## 2021-03-26 DIAGNOSIS — C4442 Squamous cell carcinoma of skin of scalp and neck: Secondary | ICD-10-CM | POA: Diagnosis not present

## 2021-04-16 DIAGNOSIS — C4442 Squamous cell carcinoma of skin of scalp and neck: Secondary | ICD-10-CM | POA: Diagnosis not present

## 2021-04-16 DIAGNOSIS — C44319 Basal cell carcinoma of skin of other parts of face: Secondary | ICD-10-CM | POA: Diagnosis not present

## 2021-06-08 DIAGNOSIS — E875 Hyperkalemia: Secondary | ICD-10-CM | POA: Diagnosis not present

## 2021-06-08 DIAGNOSIS — L659 Nonscarring hair loss, unspecified: Secondary | ICD-10-CM | POA: Diagnosis not present

## 2021-06-08 DIAGNOSIS — M1990 Unspecified osteoarthritis, unspecified site: Secondary | ICD-10-CM | POA: Diagnosis not present

## 2021-06-08 DIAGNOSIS — D539 Nutritional anemia, unspecified: Secondary | ICD-10-CM | POA: Diagnosis not present

## 2021-06-08 DIAGNOSIS — Z6823 Body mass index (BMI) 23.0-23.9, adult: Secondary | ICD-10-CM | POA: Diagnosis not present

## 2021-06-08 DIAGNOSIS — E785 Hyperlipidemia, unspecified: Secondary | ICD-10-CM | POA: Diagnosis not present

## 2021-06-08 DIAGNOSIS — I471 Supraventricular tachycardia: Secondary | ICD-10-CM | POA: Diagnosis not present

## 2021-06-08 DIAGNOSIS — E871 Hypo-osmolality and hyponatremia: Secondary | ICD-10-CM | POA: Diagnosis not present

## 2021-06-08 DIAGNOSIS — M810 Age-related osteoporosis without current pathological fracture: Secondary | ICD-10-CM | POA: Diagnosis not present

## 2021-06-08 DIAGNOSIS — I10 Essential (primary) hypertension: Secondary | ICD-10-CM | POA: Diagnosis not present

## 2021-06-08 DIAGNOSIS — R739 Hyperglycemia, unspecified: Secondary | ICD-10-CM | POA: Diagnosis not present

## 2021-06-15 DIAGNOSIS — I10 Essential (primary) hypertension: Secondary | ICD-10-CM | POA: Diagnosis not present

## 2021-07-02 DIAGNOSIS — L570 Actinic keratosis: Secondary | ICD-10-CM | POA: Diagnosis not present

## 2021-07-02 DIAGNOSIS — L578 Other skin changes due to chronic exposure to nonionizing radiation: Secondary | ICD-10-CM | POA: Diagnosis not present

## 2021-07-02 DIAGNOSIS — C44329 Squamous cell carcinoma of skin of other parts of face: Secondary | ICD-10-CM | POA: Diagnosis not present

## 2021-07-16 DIAGNOSIS — Z139 Encounter for screening, unspecified: Secondary | ICD-10-CM | POA: Diagnosis not present

## 2021-07-16 DIAGNOSIS — I10 Essential (primary) hypertension: Secondary | ICD-10-CM | POA: Diagnosis not present

## 2021-08-31 DIAGNOSIS — E559 Vitamin D deficiency, unspecified: Secondary | ICD-10-CM | POA: Diagnosis not present

## 2021-08-31 DIAGNOSIS — D539 Nutritional anemia, unspecified: Secondary | ICD-10-CM | POA: Diagnosis not present

## 2021-08-31 DIAGNOSIS — R739 Hyperglycemia, unspecified: Secondary | ICD-10-CM | POA: Diagnosis not present

## 2021-08-31 DIAGNOSIS — L659 Nonscarring hair loss, unspecified: Secondary | ICD-10-CM | POA: Diagnosis not present

## 2021-08-31 DIAGNOSIS — Z6823 Body mass index (BMI) 23.0-23.9, adult: Secondary | ICD-10-CM | POA: Diagnosis not present

## 2021-08-31 DIAGNOSIS — I10 Essential (primary) hypertension: Secondary | ICD-10-CM | POA: Diagnosis not present

## 2021-08-31 DIAGNOSIS — Z1331 Encounter for screening for depression: Secondary | ICD-10-CM | POA: Diagnosis not present

## 2021-08-31 DIAGNOSIS — E785 Hyperlipidemia, unspecified: Secondary | ICD-10-CM | POA: Diagnosis not present

## 2021-08-31 DIAGNOSIS — M810 Age-related osteoporosis without current pathological fracture: Secondary | ICD-10-CM | POA: Diagnosis not present

## 2021-08-31 DIAGNOSIS — Z9181 History of falling: Secondary | ICD-10-CM | POA: Diagnosis not present

## 2021-10-02 DIAGNOSIS — J069 Acute upper respiratory infection, unspecified: Secondary | ICD-10-CM | POA: Diagnosis not present

## 2021-10-02 DIAGNOSIS — J309 Allergic rhinitis, unspecified: Secondary | ICD-10-CM | POA: Diagnosis not present

## 2021-10-24 DIAGNOSIS — M810 Age-related osteoporosis without current pathological fracture: Secondary | ICD-10-CM | POA: Diagnosis not present

## 2021-11-30 DIAGNOSIS — E785 Hyperlipidemia, unspecified: Secondary | ICD-10-CM | POA: Diagnosis not present

## 2021-11-30 DIAGNOSIS — Z9181 History of falling: Secondary | ICD-10-CM | POA: Diagnosis not present

## 2021-11-30 DIAGNOSIS — Z1331 Encounter for screening for depression: Secondary | ICD-10-CM | POA: Diagnosis not present

## 2021-11-30 DIAGNOSIS — Z0000 Encounter for general adult medical examination without abnormal findings: Secondary | ICD-10-CM | POA: Diagnosis not present

## 2021-12-11 DIAGNOSIS — R739 Hyperglycemia, unspecified: Secondary | ICD-10-CM | POA: Diagnosis not present

## 2021-12-11 DIAGNOSIS — E785 Hyperlipidemia, unspecified: Secondary | ICD-10-CM | POA: Diagnosis not present

## 2021-12-11 DIAGNOSIS — L659 Nonscarring hair loss, unspecified: Secondary | ICD-10-CM | POA: Diagnosis not present

## 2021-12-11 DIAGNOSIS — M810 Age-related osteoporosis without current pathological fracture: Secondary | ICD-10-CM | POA: Diagnosis not present

## 2021-12-11 DIAGNOSIS — D539 Nutritional anemia, unspecified: Secondary | ICD-10-CM | POA: Diagnosis not present

## 2021-12-11 DIAGNOSIS — E559 Vitamin D deficiency, unspecified: Secondary | ICD-10-CM | POA: Diagnosis not present

## 2021-12-11 DIAGNOSIS — I10 Essential (primary) hypertension: Secondary | ICD-10-CM | POA: Diagnosis not present

## 2021-12-21 ENCOUNTER — Ambulatory Visit (INDEPENDENT_AMBULATORY_CARE_PROVIDER_SITE_OTHER): Payer: Medicare Other | Admitting: Cardiology

## 2021-12-21 ENCOUNTER — Encounter: Payer: Self-pay | Admitting: Cardiology

## 2021-12-21 VITALS — BP 170/66 | HR 76 | Ht 67.0 in | Wt 143.0 lb

## 2021-12-21 DIAGNOSIS — R002 Palpitations: Secondary | ICD-10-CM | POA: Diagnosis not present

## 2021-12-21 DIAGNOSIS — I10 Essential (primary) hypertension: Secondary | ICD-10-CM

## 2021-12-21 DIAGNOSIS — R072 Precordial pain: Secondary | ICD-10-CM | POA: Diagnosis not present

## 2021-12-21 DIAGNOSIS — R0789 Other chest pain: Secondary | ICD-10-CM

## 2021-12-21 DIAGNOSIS — E875 Hyperkalemia: Secondary | ICD-10-CM | POA: Diagnosis not present

## 2021-12-21 NOTE — Progress Notes (Signed)
Cardiology Office Note:    Date:  12/21/2021   ID:  Veronica Maynard, DOB [birth year], MRN [id number]  PCP:  Nicoletta Dress, MD  Cardiologist:  Jenne Campus, MD    Referring MD: Nicoletta Dress, MD   No chief complaint on file.   History of Present Illness:    Veronica Maynard is a 81 y.o. female past medical history significant for essential hypertension which seems to be somewhat difficult to control.  We try ARB however that caused some hyperkalemia.  Eventually she was switched to Norvasc blood pressure seems to be fairly controlled now.  She said at home 120/70 always elevated in the office but clearly there is some component of whitecoat hypertension.  Also history of dyslipidemia and palpitations.  She is coming today to my office for follow-up she is disappointed with her potassium being high they have a garden she eats a lot of potatoes tomatoes cantaloupe which probably responsible for her hyperkalemia  Past Medical History:  Diagnosis Date   Arthritis    Essential hypertension 07/04/2016   Headache    due to hormone replacement   Hypertension    OA (osteoarthritis) of hip 06/08/2014   Palpitations 07/04/2016   Pure hypertriglyceridemia 07/04/2016    Past Surgical History:  Procedure Laterality Date   SQUAMOUS CELL CARCINOMA EXCISION     TONSILLECTOMY     age 71   TOTAL HIP ARTHROPLASTY Right 06/08/2014   Procedure: RIGHT TOTAL HIP ARTHROPLASTY ANTERIOR APPROACH;  Surgeon: Gearlean Alf, MD;  Location: [facility];  Service: Orthopedics;  Laterality: Right;   TUBAL LIGATION      Current Medications: Current Meds  Medication Sig   amLODipine (NORVASC) 2.5 MG tablet Take 5 mg by mouth daily.   aspirin EC 81 MG tablet Take 81 mg by mouth daily.   denosumab (PROLIA) 60 MG/ML SOSY injection Inject 60 mg into the skin every 6 (six) months.   fluticasone (FLONASE) 50 MCG/ACT nasal spray Place 2 sprays into both nostrils every evening.   loratadine (CLARITIN) 10 MG  tablet Take 10 mg by mouth daily.   meclizine (ANTIVERT) 25 MG tablet Take 25 mg by mouth every 6 (six) hours as needed for nausea or dizziness.   metoprolol succinate (TOPROL-XL) 50 MG 24 hr tablet Take 50 mg by mouth every evening.    pravastatin (PRAVACHOL) 20 MG tablet Take 20 mg by mouth daily.     Allergies:   Rivaroxaban   Social History   Socioeconomic History   Marital status: Married    Spouse name: Not on file   Number of children: Not on file   Years of education: Not on file   Highest education level: Not on file  Occupational History   Not on file  Tobacco Use   Smoking status: Never   Smokeless tobacco: Never  Vaping Use   Vaping Use: Never used  Substance and Sexual Activity   Alcohol use: Yes    Comment: rarely   Drug use: No   Sexual activity: Not on file  Other Topics Concern   Not on file  Social History Narrative   Not on file   Social Determinants of Health   Financial Resource Strain: Not on file  Food Insecurity: Not on file  Transportation Needs: Not on file  Physical Activity: Not on file  Stress: Not on file  Social Connections: Not on file     Family History: The patient's family history includes Heart disease  in her father and mother. ROS:   Please see the history of present illness.    All 14 point review of systems negative except as described per history of present illness  EKGs/Labs/Other Studies Reviewed:      Recent Labs: No results found for requested labs within last 365 days.  Recent Lipid Panel No results found for: "CHOL", "TRIG", "HDL", "CHOLHDL", "VLDL", "LDLCALC", "LDLDIRECT"  Physical Exam:    VS:  BP (!) 168/72 (BP Location: Right Arm, Patient Position: Sitting)   Pulse 76   Ht '5\' 7"'$  (1.702 m)   Wt 143 lb (64.9 kg)   SpO2 94%   BMI 22.40 kg/m     Wt Readings from Last 3 Encounters:  12/21/21 143 lb (64.9 kg)  11/08/20 150 lb (68 kg)  04/19/20 150 lb (68 kg)     GEN:  Well nourished, well developed  in no acute distress HEENT: Normal NECK: No JVD; No carotid bruits LYMPHATICS: No lymphadenopathy CARDIAC: RRR, no murmurs, no rubs, no gallops RESPIRATORY:  Clear to auscultation without rales, wheezing or rhonchi  ABDOMEN: Soft, non-tender, non-distended MUSCULOSKELETAL:  No edema; No deformity  SKIN: Warm and dry LOWER EXTREMITIES: no swelling NEUROLOGIC:  Alert and oriented x 3 PSYCHIATRIC:  Normal affect   ASSESSMENT:    1. Essential hypertension   2. Palpitations   3. Atypical chest pain   4. Hyperkalemia    PLAN:    In order of problems listed above:  Essential hypertension blood pressure seems to be controlled continue present management Palpitations: Denies having any Atypical chest pain she describes episodes maybe 1 and a month or so last episode she remembers she was kitchen she was working she started having some uneasy sensation in the chest rates about 4-5 in scale of 1-10, no shortness of breath no sweating associated with this sensation she rested for a little bit the sensation went away she said the entire duration of this sensation was just few seconds to maybe 1 minute.  I will schedule her to have a stress test Hyperkalemia problem she had persistent problem with this I think she can benefit from seeing nephrologist.  We will make a referral for nephrology   Medication Adjustments/Labs and Tests Ordered: Current medicines are reviewed at length with the patient today.  Concerns regarding medicines are outlined above.  No orders of the defined types were placed in this encounter.  Medication changes: No orders of the defined types were placed in this encounter.   Signed, Park Liter, MD, [facility] 12/21/2021 2:52 PM    [facility]

## 2021-12-21 NOTE — Addendum Note (Signed)
Addended by: Jacobo Forest D on: 12/21/2021 03:10 PM   Modules accepted: Orders

## 2021-12-21 NOTE — Addendum Note (Signed)
Addended by: Jacobo Forest D on: 12/21/2021 03:37 PM   Modules accepted: Orders

## 2021-12-21 NOTE — Patient Instructions (Addendum)
Medication Instructions:  Your physician recommends that you continue on your current medications as directed. Please refer to the Current Medication list given to you today.  *If you need a refill on your cardiac medications before your next appointment, please call your pharmacy*   Lab Work: None Ordered If you have labs (blood work) drawn today and your tests are completely normal, you will receive your results only by: [facility] (if you have MyChart) OR A paper copy in the mail If you have any lab test that is abnormal or we need to change your treatment, we will call you to review the results.   Testing/Procedures: Your physician has requested that you have a lexiscan myoview. For further information please visit [email or web address]. Please follow instruction sheet, as given.  The test will take approximately 3 to 4 hours to complete; you may bring reading material.  If someone comes with you to your appointment, they will need to remain in the main lobby due to limited space in the testing area.   How to prepare for your Myocardial Perfusion Test: Do not eat or drink 3 hours prior to your test, except you may have water. Do not consume products containing caffeine (regular or decaffeinated) 12 hours prior to your test. (ex: coffee, chocolate, sodas, tea). Do bring a list of your current medications with you.  If not listed below, you may take your medications as normal. Do wear comfortable clothes (no dresses or overalls) and walking shoes, tennis shoes preferred (No heels or open toe shoes are allowed). Do NOT wear cologne, perfume, aftershave, or lotions (deodorant is allowed). If these instructions are not followed, your test will have to be rescheduled.     Follow-Up: At [facility], you and your health needs are our priority.  As part of our continuing mission to provide you with exceptional heart care, we have created designated Provider Care Teams.  These Care Teams  include your primary Cardiologist (physician) and Advanced Practice Providers (APPs -  Physician Assistants and Nurse Practitioners) who all work together to provide you with the care you need, when you need it.  We recommend signing up for the patient portal called "MyChart".  Sign up information is provided on this After Visit Summary.  MyChart is used to connect with patients for Virtual Visits (Telemedicine).  Patients are able to view lab/test results, encounter notes, upcoming appointments, etc.  Non-urgent messages can be sent to your provider as well.   To learn more about what you can do with MyChart, go to [email or web address].    Your next appointment:   12 month(s)  The format for your next appointment:   In Person  Provider:   Jenne Campus, MD    Other Instructions NA

## 2021-12-25 DIAGNOSIS — I10 Essential (primary) hypertension: Secondary | ICD-10-CM | POA: Diagnosis not present

## 2021-12-26 ENCOUNTER — Telehealth (HOSPITAL_COMMUNITY): Payer: Self-pay | Admitting: *Deleted

## 2021-12-26 NOTE — Telephone Encounter (Signed)
Patient given detailed instructions per Myocardial Perfusion Study Information Sheet for the test on 01/02/2022 at 7:45. Patient notified to arrive 15 minutes early and that it is imperative to arrive on time for appointment to keep from having the test rescheduled.  If you need to cancel or reschedule your appointment, please call the office within 24 hours of your appointment. . Patient verbalized understanding.Veronica Maynard

## 2021-12-31 DIAGNOSIS — L570 Actinic keratosis: Secondary | ICD-10-CM | POA: Diagnosis not present

## 2021-12-31 DIAGNOSIS — L821 Other seborrheic keratosis: Secondary | ICD-10-CM | POA: Diagnosis not present

## 2021-12-31 DIAGNOSIS — L578 Other skin changes due to chronic exposure to nonionizing radiation: Secondary | ICD-10-CM | POA: Diagnosis not present

## 2022-01-01 NOTE — Addendum Note (Signed)
Addended by: Jenne Campus on: 01/01/2022 09:00 AM   Modules accepted: Orders

## 2022-01-02 ENCOUNTER — Ambulatory Visit (INDEPENDENT_AMBULATORY_CARE_PROVIDER_SITE_OTHER): Payer: Medicare Other

## 2022-01-02 DIAGNOSIS — R072 Precordial pain: Secondary | ICD-10-CM | POA: Diagnosis not present

## 2022-01-02 LAB — MYOCARDIAL PERFUSION IMAGING
LV dias vol: 51 mL (ref 46–106)
LV sys vol: 12 mL
Nuc Stress EF: 77 %
Peak HR: 84 {beats}/min
Rest HR: 59 {beats}/min
Rest Nuclear Isotope Dose: 10.8 mCi
SDS: 1
SRS: 0
SSS: 1
Stress Nuclear Isotope Dose: 31.6 mCi
TID: 1.02

## 2022-01-02 MED ORDER — AMINOPHYLLINE 25 MG/ML IV SOLN
75.0000 mg | Freq: Once | INTRAVENOUS | Status: AC
  Administered 2022-01-02: 75 mg via INTRAVENOUS

## 2022-01-02 MED ORDER — REGADENOSON 0.4 MG/5ML IV SOLN
0.4000 mg | Freq: Once | INTRAVENOUS | Status: AC
  Administered 2022-01-02: 0.4 mg via INTRAVENOUS

## 2022-01-02 MED ORDER — TECHNETIUM TC 99M TETROFOSMIN IV KIT
10.8000 | PACK | Freq: Once | INTRAVENOUS | Status: AC | PRN
  Administered 2022-01-02: 10.8 via INTRAVENOUS

## 2022-01-02 MED ORDER — TECHNETIUM TC 99M TETROFOSMIN IV KIT
31.6000 | PACK | Freq: Once | INTRAVENOUS | Status: AC | PRN
  Administered 2022-01-02: 31.6 via INTRAVENOUS

## 2022-01-17 ENCOUNTER — Telehealth: Payer: Self-pay

## 2022-01-17 NOTE — Telephone Encounter (Signed)
Results reviewed with pt as per Dr. Krasowski's note.  Pt verbalized understanding and had no additional questions. Routed to PCP  

## 2022-01-28 DIAGNOSIS — Z1231 Encounter for screening mammogram for malignant neoplasm of breast: Secondary | ICD-10-CM | POA: Diagnosis not present

## 2022-02-26 DIAGNOSIS — Z23 Encounter for immunization: Secondary | ICD-10-CM | POA: Diagnosis not present

## 2022-03-04 DIAGNOSIS — Z23 Encounter for immunization: Secondary | ICD-10-CM | POA: Diagnosis not present

## 2022-04-02 DIAGNOSIS — H35371 Puckering of macula, right eye: Secondary | ICD-10-CM | POA: Diagnosis not present

## 2022-04-02 DIAGNOSIS — Z9849 Cataract extraction status, unspecified eye: Secondary | ICD-10-CM | POA: Diagnosis not present

## 2022-04-02 DIAGNOSIS — H5203 Hypermetropia, bilateral: Secondary | ICD-10-CM | POA: Diagnosis not present

## 2022-04-02 DIAGNOSIS — Z961 Presence of intraocular lens: Secondary | ICD-10-CM | POA: Diagnosis not present

## 2022-04-02 DIAGNOSIS — H524 Presbyopia: Secondary | ICD-10-CM | POA: Diagnosis not present

## 2022-04-02 DIAGNOSIS — H35341 Macular cyst, hole, or pseudohole, right eye: Secondary | ICD-10-CM | POA: Diagnosis not present

## 2022-04-02 DIAGNOSIS — H52223 Regular astigmatism, bilateral: Secondary | ICD-10-CM | POA: Diagnosis not present

## 2022-04-12 DIAGNOSIS — H5711 Ocular pain, right eye: Secondary | ICD-10-CM | POA: Diagnosis not present

## 2022-04-12 DIAGNOSIS — H1031 Unspecified acute conjunctivitis, right eye: Secondary | ICD-10-CM | POA: Diagnosis not present

## 2022-04-17 DIAGNOSIS — Q103 Other congenital malformations of eyelid: Secondary | ICD-10-CM | POA: Diagnosis not present

## 2022-04-23 DIAGNOSIS — R739 Hyperglycemia, unspecified: Secondary | ICD-10-CM | POA: Diagnosis not present

## 2022-04-23 DIAGNOSIS — E785 Hyperlipidemia, unspecified: Secondary | ICD-10-CM | POA: Diagnosis not present

## 2022-04-23 DIAGNOSIS — D539 Nutritional anemia, unspecified: Secondary | ICD-10-CM | POA: Diagnosis not present

## 2022-04-23 DIAGNOSIS — R7303 Prediabetes: Secondary | ICD-10-CM | POA: Diagnosis not present

## 2022-04-23 DIAGNOSIS — L720 Epidermal cyst: Secondary | ICD-10-CM | POA: Diagnosis not present

## 2022-04-23 DIAGNOSIS — E875 Hyperkalemia: Secondary | ICD-10-CM | POA: Diagnosis not present

## 2022-04-23 DIAGNOSIS — I10 Essential (primary) hypertension: Secondary | ICD-10-CM | POA: Diagnosis not present

## 2022-04-23 DIAGNOSIS — E559 Vitamin D deficiency, unspecified: Secondary | ICD-10-CM | POA: Diagnosis not present

## 2022-05-02 DIAGNOSIS — M810 Age-related osteoporosis without current pathological fracture: Secondary | ICD-10-CM | POA: Diagnosis not present

## 2022-05-10 ENCOUNTER — Other Ambulatory Visit: Payer: Self-pay | Admitting: Nephrology

## 2022-05-10 DIAGNOSIS — N1832 Chronic kidney disease, stage 3b: Secondary | ICD-10-CM

## 2022-05-10 DIAGNOSIS — I129 Hypertensive chronic kidney disease with stage 1 through stage 4 chronic kidney disease, or unspecified chronic kidney disease: Secondary | ICD-10-CM | POA: Diagnosis not present

## 2022-05-10 DIAGNOSIS — E875 Hyperkalemia: Secondary | ICD-10-CM | POA: Diagnosis not present

## 2022-05-10 DIAGNOSIS — I4891 Unspecified atrial fibrillation: Secondary | ICD-10-CM | POA: Diagnosis not present

## 2022-05-17 DIAGNOSIS — N189 Chronic kidney disease, unspecified: Secondary | ICD-10-CM | POA: Diagnosis not present

## 2022-05-17 DIAGNOSIS — N281 Cyst of kidney, acquired: Secondary | ICD-10-CM | POA: Diagnosis not present

## 2022-05-17 DIAGNOSIS — N1832 Chronic kidney disease, stage 3b: Secondary | ICD-10-CM | POA: Diagnosis not present

## 2022-05-27 DIAGNOSIS — I10 Essential (primary) hypertension: Secondary | ICD-10-CM | POA: Diagnosis not present

## 2022-05-27 DIAGNOSIS — N1831 Chronic kidney disease, stage 3a: Secondary | ICD-10-CM | POA: Diagnosis not present

## 2022-05-27 DIAGNOSIS — I471 Supraventricular tachycardia, unspecified: Secondary | ICD-10-CM | POA: Diagnosis not present

## 2022-06-20 DIAGNOSIS — L570 Actinic keratosis: Secondary | ICD-10-CM | POA: Diagnosis not present

## 2022-06-20 DIAGNOSIS — L821 Other seborrheic keratosis: Secondary | ICD-10-CM | POA: Diagnosis not present

## 2022-06-20 DIAGNOSIS — D485 Neoplasm of uncertain behavior of skin: Secondary | ICD-10-CM | POA: Diagnosis not present

## 2022-06-20 DIAGNOSIS — L300 Nummular dermatitis: Secondary | ICD-10-CM | POA: Diagnosis not present

## 2022-06-28 DIAGNOSIS — I10 Essential (primary) hypertension: Secondary | ICD-10-CM | POA: Diagnosis not present

## 2022-08-14 DIAGNOSIS — R2231 Localized swelling, mass and lump, right upper limb: Secondary | ICD-10-CM | POA: Diagnosis not present

## 2022-08-14 DIAGNOSIS — M13841 Other specified arthritis, right hand: Secondary | ICD-10-CM | POA: Diagnosis not present

## 2022-08-15 DIAGNOSIS — R52 Pain, unspecified: Secondary | ICD-10-CM | POA: Insufficient documentation

## 2022-09-06 DIAGNOSIS — R7303 Prediabetes: Secondary | ICD-10-CM | POA: Diagnosis not present

## 2022-09-06 DIAGNOSIS — I10 Essential (primary) hypertension: Secondary | ICD-10-CM | POA: Diagnosis not present

## 2022-09-06 DIAGNOSIS — E785 Hyperlipidemia, unspecified: Secondary | ICD-10-CM | POA: Diagnosis not present

## 2022-09-06 DIAGNOSIS — Z139 Encounter for screening, unspecified: Secondary | ICD-10-CM | POA: Diagnosis not present

## 2022-09-06 DIAGNOSIS — N1831 Chronic kidney disease, stage 3a: Secondary | ICD-10-CM | POA: Diagnosis not present

## 2022-09-06 DIAGNOSIS — I471 Supraventricular tachycardia, unspecified: Secondary | ICD-10-CM | POA: Diagnosis not present

## 2022-09-06 DIAGNOSIS — E875 Hyperkalemia: Secondary | ICD-10-CM | POA: Diagnosis not present

## 2022-09-06 DIAGNOSIS — E559 Vitamin D deficiency, unspecified: Secondary | ICD-10-CM | POA: Diagnosis not present

## 2022-09-06 DIAGNOSIS — D539 Nutritional anemia, unspecified: Secondary | ICD-10-CM | POA: Diagnosis not present

## 2022-09-18 DIAGNOSIS — I10 Essential (primary) hypertension: Secondary | ICD-10-CM | POA: Diagnosis not present

## 2022-12-03 DIAGNOSIS — T63443A Toxic effect of venom of bees, assault, initial encounter: Secondary | ICD-10-CM | POA: Diagnosis not present

## 2022-12-03 DIAGNOSIS — R21 Rash and other nonspecific skin eruption: Secondary | ICD-10-CM | POA: Diagnosis not present

## 2022-12-17 ENCOUNTER — Ambulatory Visit: Payer: Medicare Other | Admitting: Cardiology

## 2023-01-06 DIAGNOSIS — I10 Essential (primary) hypertension: Secondary | ICD-10-CM | POA: Diagnosis not present

## 2023-01-06 DIAGNOSIS — Z1331 Encounter for screening for depression: Secondary | ICD-10-CM | POA: Diagnosis not present

## 2023-01-06 DIAGNOSIS — E875 Hyperkalemia: Secondary | ICD-10-CM | POA: Diagnosis not present

## 2023-01-06 DIAGNOSIS — E559 Vitamin D deficiency, unspecified: Secondary | ICD-10-CM | POA: Diagnosis not present

## 2023-01-06 DIAGNOSIS — D539 Nutritional anemia, unspecified: Secondary | ICD-10-CM | POA: Diagnosis not present

## 2023-01-06 DIAGNOSIS — E785 Hyperlipidemia, unspecified: Secondary | ICD-10-CM | POA: Diagnosis not present

## 2023-01-06 DIAGNOSIS — R7303 Prediabetes: Secondary | ICD-10-CM | POA: Diagnosis not present

## 2023-01-06 DIAGNOSIS — N1831 Chronic kidney disease, stage 3a: Secondary | ICD-10-CM | POA: Diagnosis not present

## 2023-01-06 DIAGNOSIS — I471 Supraventricular tachycardia, unspecified: Secondary | ICD-10-CM | POA: Diagnosis not present

## 2023-01-06 DIAGNOSIS — M810 Age-related osteoporosis without current pathological fracture: Secondary | ICD-10-CM | POA: Diagnosis not present

## 2023-01-06 DIAGNOSIS — Z9181 History of falling: Secondary | ICD-10-CM | POA: Diagnosis not present

## 2023-01-15 DIAGNOSIS — Z9181 History of falling: Secondary | ICD-10-CM | POA: Diagnosis not present

## 2023-01-15 DIAGNOSIS — Z0000 Encounter for general adult medical examination without abnormal findings: Secondary | ICD-10-CM | POA: Diagnosis not present

## 2023-02-03 DIAGNOSIS — Z1231 Encounter for screening mammogram for malignant neoplasm of breast: Secondary | ICD-10-CM | POA: Diagnosis not present

## 2023-02-24 DIAGNOSIS — C44722 Squamous cell carcinoma of skin of right lower limb, including hip: Secondary | ICD-10-CM | POA: Diagnosis not present

## 2023-02-24 DIAGNOSIS — L578 Other skin changes due to chronic exposure to nonionizing radiation: Secondary | ICD-10-CM | POA: Diagnosis not present

## 2023-02-24 DIAGNOSIS — L570 Actinic keratosis: Secondary | ICD-10-CM | POA: Diagnosis not present

## 2023-02-24 DIAGNOSIS — L821 Other seborrheic keratosis: Secondary | ICD-10-CM | POA: Diagnosis not present

## 2023-02-24 DIAGNOSIS — C44319 Basal cell carcinoma of skin of other parts of face: Secondary | ICD-10-CM | POA: Diagnosis not present

## 2023-02-26 DIAGNOSIS — Z23 Encounter for immunization: Secondary | ICD-10-CM | POA: Diagnosis not present

## 2023-03-03 ENCOUNTER — Ambulatory Visit: Payer: Medicare Other | Admitting: Cardiology

## 2023-03-05 DIAGNOSIS — Z23 Encounter for immunization: Secondary | ICD-10-CM | POA: Diagnosis not present

## 2023-03-31 ENCOUNTER — Encounter: Payer: Self-pay | Admitting: Cardiology

## 2023-03-31 ENCOUNTER — Ambulatory Visit: Payer: Medicare Other | Attending: Cardiology | Admitting: Cardiology

## 2023-03-31 VITALS — BP 136/80 | HR 70 | Ht 67.0 in | Wt 135.2 lb

## 2023-03-31 DIAGNOSIS — R002 Palpitations: Secondary | ICD-10-CM | POA: Insufficient documentation

## 2023-03-31 DIAGNOSIS — R0789 Other chest pain: Secondary | ICD-10-CM | POA: Diagnosis not present

## 2023-03-31 DIAGNOSIS — I10 Essential (primary) hypertension: Secondary | ICD-10-CM | POA: Diagnosis not present

## 2023-03-31 NOTE — Patient Instructions (Signed)

## 2023-03-31 NOTE — Progress Notes (Signed)
Cardiology Office Note:    Date:  03/31/2023   ID:  Veronica Maynard, DOB 09-09-1940, MRN [id number]  PCP:  Paulina Fusi, MD  Cardiologist:  Gypsy Balsam, MD    Referring MD: Paulina Fusi, MD   Chief Complaint  Patient presents with   BP evaluation   yearly follow up    History of Present Illness:    Veronica Maynard is a 82 y.o. female with past medical history significant for essential hypertension which seems to be somewhat difficult to control, whitecoat hypertension, atypical chest pain, palpitations.  Comes today to months for follow-up overall she is doing great denies of any chest pain tightness squeezing pressure burning chest she does what she wants to do  She isvivid reader and she is actually also reading book when I walk into the room.  Past Medical History:  Diagnosis Date   Arthritis    Essential hypertension 07/04/2016   Headache    due to hormone replacement   Hypertension    OA (osteoarthritis) of hip 06/08/2014   Palpitations 07/04/2016   Pure hypertriglyceridemia 07/04/2016    Past Surgical History:  Procedure Laterality Date   SQUAMOUS CELL CARCINOMA EXCISION     TONSILLECTOMY     age 24   TOTAL HIP ARTHROPLASTY Right 06/08/2014   Procedure: RIGHT TOTAL HIP ARTHROPLASTY ANTERIOR APPROACH;  Surgeon: Loanne Drilling, MD;  Location: [facility];  Service: Orthopedics;  Laterality: Right;   TUBAL LIGATION      Current Medications: Current Meds  Medication Sig   amLODipine (NORVASC) 2.5 MG tablet Take 5 mg by mouth daily.   aspirin EC 81 MG tablet Take 81 mg by mouth daily.   denosumab (PROLIA) 60 MG/ML SOSY injection Inject 60 mg into the skin every 6 (six) months.   finasteride (PROSCAR) 5 MG tablet Take 5 mg by mouth daily.   fluticasone (FLONASE) 50 MCG/ACT nasal spray Place 2 sprays into both nostrils every evening.   loratadine (CLARITIN) 10 MG tablet Take 10 mg by mouth daily.   meclizine (ANTIVERT) 25 MG tablet Take 25 mg by mouth every 6  (six) hours as needed for nausea or dizziness.   metoprolol succinate (TOPROL-XL) 50 MG 24 hr tablet Take 25-50 mg by mouth See admin instructions. 25mg  in the morning and 50mg  in the evening   pravastatin (PRAVACHOL) 20 MG tablet Take 20 mg by mouth daily.     Allergies:   Rivaroxaban   Social History   Socioeconomic History   Marital status: Married    Spouse name: Not on file   Number of children: Not on file   Years of education: Not on file   Highest education level: Not on file  Occupational History   Not on file  Tobacco Use   Smoking status: Never   Smokeless tobacco: Never  Vaping Use   Vaping status: Never Used  Substance and Sexual Activity   Alcohol use: Yes    Comment: rarely   Drug use: No   Sexual activity: Not on file  Other Topics Concern   Not on file  Social History Narrative   Not on file   Social Determinants of Health   Financial Resource Strain: Not on file  Food Insecurity: Not on file  Transportation Needs: Not on file  Physical Activity: Not on file  Stress: Not on file  Social Connections: Not on file     Family History: The patient's family history includes Heart disease in  her father and mother. ROS:   Please see the history of present illness.    All 14 point review of systems negative except as described per history of present illness  EKGs/Labs/Other Studies Reviewed:    EKG Interpretation Date/Time:  Monday March 31 2023 10:35:49 EST Ventricular Rate:  70 PR Interval:  150 QRS Duration:  82 QT Interval:  382 QTC Calculation: 412 R Axis:   49  Text Interpretation: Normal sinus rhythm Normal ECG When compared with ECG of 01-Jun-2014 14:00, No significant change was found Confirmed by Gypsy Balsam [phone]) on 03/31/2023 10:43:30 AM    Recent Labs: No results found for requested labs within last 365 days.  Recent Lipid Panel No results found for: "CHOL", "TRIG", "HDL", "CHOLHDL", "VLDL", "LDLCALC",  "LDLDIRECT"  Physical Exam:    VS:  BP 136/80 (BP Location: Left Arm, Patient Position: Sitting)   Pulse 70   Ht 5\' 7"  (1.702 m)   Wt 135 lb 3.2 oz (61.3 kg)   SpO2 94%   BMI 21.18 kg/m     Wt Readings from Last 3 Encounters:  03/31/23 135 lb 3.2 oz (61.3 kg)  01/02/22 143 lb (64.9 kg)  12/21/21 143 lb (64.9 kg)     GEN:  Well nourished, well developed in no acute distress HEENT: Normal NECK: No JVD; No carotid bruits LYMPHATICS: No lymphadenopathy CARDIAC: RRR, no murmurs, no rubs, no gallops RESPIRATORY:  Clear to auscultation without rales, wheezing or rhonchi  ABDOMEN: Soft, non-tender, non-distended MUSCULOSKELETAL:  No edema; No deformity  SKIN: Warm and dry LOWER EXTREMITIES: no swelling NEUROLOGIC:  Alert and oriented x 3 PSYCHIATRIC:  Normal affect   ASSESSMENT:    1. Essential hypertension   2. Palpitations   3. Atypical chest pain    PLAN:    In order of problems listed above:  Essential hypertension blood pressure well-controlled we will continue present management. Palpitations recent to increase dose of metoprolol which improve palpitations and those are completely gone. Atypical chest pain denies having any, stress test review negative. Dyslipidemia she had difficulty tolerating statin.  She is on pravastatin 20 mg daily and her HDL is 94 LDL at 10 hide will continue present medications   Medication Adjustments/Labs and Tests Ordered: Current medicines are reviewed at length with the patient today.  Concerns regarding medicines are outlined above.  Orders Placed This Encounter  Procedures   EKG 12-Lead   Medication changes: No orders of the defined types were placed in this encounter.   Signed, Georgeanna Lea, MD, [facility] 03/31/2023 10:54 AM    [facility]

## 2023-04-03 DIAGNOSIS — L03114 Cellulitis of left upper limb: Secondary | ICD-10-CM | POA: Diagnosis not present

## 2023-04-03 DIAGNOSIS — W57XXXA Bitten or stung by nonvenomous insect and other nonvenomous arthropods, initial encounter: Secondary | ICD-10-CM | POA: Diagnosis not present

## 2023-04-03 DIAGNOSIS — S50862A Insect bite (nonvenomous) of left forearm, initial encounter: Secondary | ICD-10-CM | POA: Diagnosis not present

## 2023-04-07 DIAGNOSIS — W57XXXD Bitten or stung by nonvenomous insect and other nonvenomous arthropods, subsequent encounter: Secondary | ICD-10-CM | POA: Diagnosis not present

## 2023-04-07 DIAGNOSIS — L03114 Cellulitis of left upper limb: Secondary | ICD-10-CM | POA: Diagnosis not present

## 2023-04-07 DIAGNOSIS — S50862D Insect bite (nonvenomous) of left forearm, subsequent encounter: Secondary | ICD-10-CM | POA: Diagnosis not present

## 2023-04-11 DIAGNOSIS — W57XXXD Bitten or stung by nonvenomous insect and other nonvenomous arthropods, subsequent encounter: Secondary | ICD-10-CM | POA: Diagnosis not present

## 2023-04-11 DIAGNOSIS — L089 Local infection of the skin and subcutaneous tissue, unspecified: Secondary | ICD-10-CM | POA: Diagnosis not present

## 2023-04-11 DIAGNOSIS — I10 Essential (primary) hypertension: Secondary | ICD-10-CM | POA: Diagnosis not present

## 2023-04-11 DIAGNOSIS — S50862D Insect bite (nonvenomous) of left forearm, subsequent encounter: Secondary | ICD-10-CM | POA: Diagnosis not present

## 2023-04-11 DIAGNOSIS — L03114 Cellulitis of left upper limb: Secondary | ICD-10-CM | POA: Diagnosis not present

## 2023-04-22 ENCOUNTER — Ambulatory Visit: Payer: Medicare Other | Admitting: Cardiology

## 2023-04-30 DIAGNOSIS — H35341 Macular cyst, hole, or pseudohole, right eye: Secondary | ICD-10-CM | POA: Diagnosis not present

## 2023-04-30 DIAGNOSIS — Z961 Presence of intraocular lens: Secondary | ICD-10-CM | POA: Diagnosis not present

## 2023-04-30 DIAGNOSIS — Z9849 Cataract extraction status, unspecified eye: Secondary | ICD-10-CM | POA: Diagnosis not present

## 2023-04-30 DIAGNOSIS — H5203 Hypermetropia, bilateral: Secondary | ICD-10-CM | POA: Diagnosis not present

## 2023-04-30 DIAGNOSIS — H524 Presbyopia: Secondary | ICD-10-CM | POA: Diagnosis not present

## 2023-04-30 DIAGNOSIS — H35371 Puckering of macula, right eye: Secondary | ICD-10-CM | POA: Diagnosis not present

## 2023-04-30 DIAGNOSIS — H52223 Regular astigmatism, bilateral: Secondary | ICD-10-CM | POA: Diagnosis not present

## 2023-05-06 DIAGNOSIS — N1831 Chronic kidney disease, stage 3a: Secondary | ICD-10-CM | POA: Diagnosis not present

## 2023-05-06 DIAGNOSIS — L03114 Cellulitis of left upper limb: Secondary | ICD-10-CM | POA: Diagnosis not present

## 2023-05-06 DIAGNOSIS — I471 Supraventricular tachycardia, unspecified: Secondary | ICD-10-CM | POA: Diagnosis not present

## 2023-05-06 DIAGNOSIS — Z6820 Body mass index (BMI) 20.0-20.9, adult: Secondary | ICD-10-CM | POA: Diagnosis not present

## 2023-05-06 DIAGNOSIS — E785 Hyperlipidemia, unspecified: Secondary | ICD-10-CM | POA: Diagnosis not present

## 2023-05-06 DIAGNOSIS — E875 Hyperkalemia: Secondary | ICD-10-CM | POA: Diagnosis not present

## 2023-05-06 DIAGNOSIS — D539 Nutritional anemia, unspecified: Secondary | ICD-10-CM | POA: Diagnosis not present

## 2023-05-06 DIAGNOSIS — R7303 Prediabetes: Secondary | ICD-10-CM | POA: Diagnosis not present

## 2023-05-06 DIAGNOSIS — E559 Vitamin D deficiency, unspecified: Secondary | ICD-10-CM | POA: Diagnosis not present

## 2023-05-06 DIAGNOSIS — M810 Age-related osteoporosis without current pathological fracture: Secondary | ICD-10-CM | POA: Diagnosis not present

## 2023-05-06 DIAGNOSIS — I10 Essential (primary) hypertension: Secondary | ICD-10-CM | POA: Diagnosis not present

## 2023-05-19 DIAGNOSIS — N1832 Chronic kidney disease, stage 3b: Secondary | ICD-10-CM | POA: Diagnosis not present

## 2023-06-02 DIAGNOSIS — I4891 Unspecified atrial fibrillation: Secondary | ICD-10-CM | POA: Diagnosis not present

## 2023-06-02 DIAGNOSIS — E875 Hyperkalemia: Secondary | ICD-10-CM | POA: Diagnosis not present

## 2023-06-02 DIAGNOSIS — N1832 Chronic kidney disease, stage 3b: Secondary | ICD-10-CM | POA: Diagnosis not present

## 2023-06-02 DIAGNOSIS — I129 Hypertensive chronic kidney disease with stage 1 through stage 4 chronic kidney disease, or unspecified chronic kidney disease: Secondary | ICD-10-CM | POA: Diagnosis not present

## 2023-06-23 DIAGNOSIS — Z6821 Body mass index (BMI) 21.0-21.9, adult: Secondary | ICD-10-CM | POA: Diagnosis not present

## 2023-06-23 DIAGNOSIS — T148XXA Other injury of unspecified body region, initial encounter: Secondary | ICD-10-CM | POA: Diagnosis not present

## 2023-06-23 DIAGNOSIS — L089 Local infection of the skin and subcutaneous tissue, unspecified: Secondary | ICD-10-CM | POA: Diagnosis not present

## 2023-06-25 DIAGNOSIS — D485 Neoplasm of uncertain behavior of skin: Secondary | ICD-10-CM | POA: Diagnosis not present

## 2023-07-17 DIAGNOSIS — H02055 Trichiasis without entropian left lower eyelid: Secondary | ICD-10-CM | POA: Diagnosis not present

## 2023-07-17 DIAGNOSIS — S0502XA Injury of conjunctiva and corneal abrasion without foreign body, left eye, initial encounter: Secondary | ICD-10-CM | POA: Diagnosis not present

## 2023-07-21 DIAGNOSIS — S0502XD Injury of conjunctiva and corneal abrasion without foreign body, left eye, subsequent encounter: Secondary | ICD-10-CM | POA: Diagnosis not present

## 2023-08-26 DIAGNOSIS — L821 Other seborrheic keratosis: Secondary | ICD-10-CM | POA: Diagnosis not present

## 2023-08-26 DIAGNOSIS — L570 Actinic keratosis: Secondary | ICD-10-CM | POA: Diagnosis not present

## 2023-08-26 DIAGNOSIS — L578 Other skin changes due to chronic exposure to nonionizing radiation: Secondary | ICD-10-CM | POA: Diagnosis not present

## 2023-08-26 DIAGNOSIS — D225 Melanocytic nevi of trunk: Secondary | ICD-10-CM | POA: Diagnosis not present

## 2023-09-11 DIAGNOSIS — I10 Essential (primary) hypertension: Secondary | ICD-10-CM | POA: Diagnosis not present

## 2023-09-11 DIAGNOSIS — R7303 Prediabetes: Secondary | ICD-10-CM | POA: Diagnosis not present

## 2023-09-11 DIAGNOSIS — E785 Hyperlipidemia, unspecified: Secondary | ICD-10-CM | POA: Diagnosis not present

## 2023-09-11 DIAGNOSIS — N1831 Chronic kidney disease, stage 3a: Secondary | ICD-10-CM | POA: Diagnosis not present

## 2023-09-11 DIAGNOSIS — M810 Age-related osteoporosis without current pathological fracture: Secondary | ICD-10-CM | POA: Diagnosis not present

## 2023-09-11 DIAGNOSIS — I471 Supraventricular tachycardia, unspecified: Secondary | ICD-10-CM | POA: Diagnosis not present

## 2023-09-11 DIAGNOSIS — E875 Hyperkalemia: Secondary | ICD-10-CM | POA: Diagnosis not present

## 2023-09-11 DIAGNOSIS — D539 Nutritional anemia, unspecified: Secondary | ICD-10-CM | POA: Diagnosis not present

## 2023-09-11 DIAGNOSIS — Z6821 Body mass index (BMI) 21.0-21.9, adult: Secondary | ICD-10-CM | POA: Diagnosis not present

## 2023-09-11 DIAGNOSIS — E559 Vitamin D deficiency, unspecified: Secondary | ICD-10-CM | POA: Diagnosis not present

## 2023-12-08 DIAGNOSIS — W01198A Fall on same level from slipping, tripping and stumbling with subsequent striking against other object, initial encounter: Secondary | ICD-10-CM | POA: Diagnosis not present

## 2023-12-08 DIAGNOSIS — S0093XA Contusion of unspecified part of head, initial encounter: Secondary | ICD-10-CM | POA: Diagnosis not present

## 2023-12-09 DIAGNOSIS — W19XXXA Unspecified fall, initial encounter: Secondary | ICD-10-CM | POA: Diagnosis not present

## 2023-12-09 DIAGNOSIS — S20212A Contusion of left front wall of thorax, initial encounter: Secondary | ICD-10-CM | POA: Diagnosis not present

## 2023-12-09 DIAGNOSIS — S0083XA Contusion of other part of head, initial encounter: Secondary | ICD-10-CM | POA: Diagnosis not present

## 2023-12-09 DIAGNOSIS — T148XXA Other injury of unspecified body region, initial encounter: Secondary | ICD-10-CM | POA: Diagnosis not present

## 2023-12-11 DIAGNOSIS — W19XXXA Unspecified fall, initial encounter: Secondary | ICD-10-CM | POA: Diagnosis not present

## 2023-12-11 DIAGNOSIS — T148XXA Other injury of unspecified body region, initial encounter: Secondary | ICD-10-CM | POA: Diagnosis not present

## 2023-12-11 DIAGNOSIS — S0083XD Contusion of other part of head, subsequent encounter: Secondary | ICD-10-CM | POA: Diagnosis not present

## 2023-12-11 DIAGNOSIS — S20212D Contusion of left front wall of thorax, subsequent encounter: Secondary | ICD-10-CM | POA: Diagnosis not present

## 2023-12-15 DIAGNOSIS — S81012A Laceration without foreign body, left knee, initial encounter: Secondary | ICD-10-CM | POA: Diagnosis not present

## 2023-12-15 DIAGNOSIS — S61512A Laceration without foreign body of left wrist, initial encounter: Secondary | ICD-10-CM | POA: Diagnosis not present

## 2023-12-16 DIAGNOSIS — I10 Essential (primary) hypertension: Secondary | ICD-10-CM | POA: Diagnosis not present

## 2023-12-16 DIAGNOSIS — T148XXA Other injury of unspecified body region, initial encounter: Secondary | ICD-10-CM | POA: Diagnosis not present

## 2023-12-16 DIAGNOSIS — S0083XD Contusion of other part of head, subsequent encounter: Secondary | ICD-10-CM | POA: Diagnosis not present

## 2023-12-16 DIAGNOSIS — S20212D Contusion of left front wall of thorax, subsequent encounter: Secondary | ICD-10-CM | POA: Diagnosis not present

## 2023-12-16 DIAGNOSIS — Z6821 Body mass index (BMI) 21.0-21.9, adult: Secondary | ICD-10-CM | POA: Diagnosis not present

## 2023-12-18 DIAGNOSIS — S81012A Laceration without foreign body, left knee, initial encounter: Secondary | ICD-10-CM | POA: Diagnosis not present

## 2023-12-18 DIAGNOSIS — S61512A Laceration without foreign body of left wrist, initial encounter: Secondary | ICD-10-CM | POA: Diagnosis not present

## 2023-12-22 DIAGNOSIS — S81812A Laceration without foreign body, left lower leg, initial encounter: Secondary | ICD-10-CM | POA: Diagnosis not present

## 2023-12-22 DIAGNOSIS — L03116 Cellulitis of left lower limb: Secondary | ICD-10-CM | POA: Diagnosis not present

## 2023-12-22 DIAGNOSIS — S41112A Laceration without foreign body of left upper arm, initial encounter: Secondary | ICD-10-CM | POA: Diagnosis not present

## 2023-12-22 DIAGNOSIS — S81012A Laceration without foreign body, left knee, initial encounter: Secondary | ICD-10-CM | POA: Diagnosis not present

## 2023-12-22 DIAGNOSIS — S61512A Laceration without foreign body of left wrist, initial encounter: Secondary | ICD-10-CM | POA: Diagnosis not present

## 2023-12-25 DIAGNOSIS — S41112A Laceration without foreign body of left upper arm, initial encounter: Secondary | ICD-10-CM | POA: Diagnosis not present

## 2023-12-25 DIAGNOSIS — S81812A Laceration without foreign body, left lower leg, initial encounter: Secondary | ICD-10-CM | POA: Diagnosis not present

## 2023-12-25 DIAGNOSIS — L03116 Cellulitis of left lower limb: Secondary | ICD-10-CM | POA: Diagnosis not present

## 2023-12-25 DIAGNOSIS — S81012A Laceration without foreign body, left knee, initial encounter: Secondary | ICD-10-CM | POA: Diagnosis not present

## 2023-12-25 DIAGNOSIS — S61512A Laceration without foreign body of left wrist, initial encounter: Secondary | ICD-10-CM | POA: Diagnosis not present

## 2023-12-30 DIAGNOSIS — S81012A Laceration without foreign body, left knee, initial encounter: Secondary | ICD-10-CM | POA: Diagnosis not present

## 2023-12-30 DIAGNOSIS — S61512A Laceration without foreign body of left wrist, initial encounter: Secondary | ICD-10-CM | POA: Diagnosis not present

## 2023-12-30 DIAGNOSIS — S41112A Laceration without foreign body of left upper arm, initial encounter: Secondary | ICD-10-CM | POA: Diagnosis not present

## 2023-12-30 DIAGNOSIS — S81812A Laceration without foreign body, left lower leg, initial encounter: Secondary | ICD-10-CM | POA: Diagnosis not present

## 2023-12-30 DIAGNOSIS — L03116 Cellulitis of left lower limb: Secondary | ICD-10-CM | POA: Diagnosis not present

## 2024-01-02 DIAGNOSIS — S61512A Laceration without foreign body of left wrist, initial encounter: Secondary | ICD-10-CM | POA: Diagnosis not present

## 2024-01-02 DIAGNOSIS — S81812A Laceration without foreign body, left lower leg, initial encounter: Secondary | ICD-10-CM | POA: Diagnosis not present

## 2024-01-02 DIAGNOSIS — L03116 Cellulitis of left lower limb: Secondary | ICD-10-CM | POA: Diagnosis not present

## 2024-01-02 DIAGNOSIS — S81012A Laceration without foreign body, left knee, initial encounter: Secondary | ICD-10-CM | POA: Diagnosis not present

## 2024-01-02 DIAGNOSIS — S41112A Laceration without foreign body of left upper arm, initial encounter: Secondary | ICD-10-CM | POA: Diagnosis not present

## 2024-01-06 DIAGNOSIS — S61512A Laceration without foreign body of left wrist, initial encounter: Secondary | ICD-10-CM | POA: Diagnosis not present

## 2024-01-06 DIAGNOSIS — S81012A Laceration without foreign body, left knee, initial encounter: Secondary | ICD-10-CM | POA: Diagnosis not present

## 2024-01-06 DIAGNOSIS — L03116 Cellulitis of left lower limb: Secondary | ICD-10-CM | POA: Diagnosis not present

## 2024-01-06 DIAGNOSIS — S81812A Laceration without foreign body, left lower leg, initial encounter: Secondary | ICD-10-CM | POA: Diagnosis not present

## 2024-01-06 DIAGNOSIS — S41112A Laceration without foreign body of left upper arm, initial encounter: Secondary | ICD-10-CM | POA: Diagnosis not present

## 2024-01-09 DIAGNOSIS — S61512A Laceration without foreign body of left wrist, initial encounter: Secondary | ICD-10-CM | POA: Diagnosis not present

## 2024-01-09 DIAGNOSIS — S81812A Laceration without foreign body, left lower leg, initial encounter: Secondary | ICD-10-CM | POA: Diagnosis not present

## 2024-01-09 DIAGNOSIS — L03116 Cellulitis of left lower limb: Secondary | ICD-10-CM | POA: Diagnosis not present

## 2024-01-09 DIAGNOSIS — S41112A Laceration without foreign body of left upper arm, initial encounter: Secondary | ICD-10-CM | POA: Diagnosis not present

## 2024-01-09 DIAGNOSIS — S81012A Laceration without foreign body, left knee, initial encounter: Secondary | ICD-10-CM | POA: Diagnosis not present

## 2024-01-13 DIAGNOSIS — S61512A Laceration without foreign body of left wrist, initial encounter: Secondary | ICD-10-CM | POA: Diagnosis not present

## 2024-01-13 DIAGNOSIS — S41112A Laceration without foreign body of left upper arm, initial encounter: Secondary | ICD-10-CM | POA: Diagnosis not present

## 2024-01-13 DIAGNOSIS — L03116 Cellulitis of left lower limb: Secondary | ICD-10-CM | POA: Diagnosis not present

## 2024-01-13 DIAGNOSIS — S81012A Laceration without foreign body, left knee, initial encounter: Secondary | ICD-10-CM | POA: Diagnosis not present

## 2024-01-13 DIAGNOSIS — S81812A Laceration without foreign body, left lower leg, initial encounter: Secondary | ICD-10-CM | POA: Diagnosis not present

## 2024-01-16 DIAGNOSIS — S81012A Laceration without foreign body, left knee, initial encounter: Secondary | ICD-10-CM | POA: Diagnosis not present

## 2024-01-16 DIAGNOSIS — R7303 Prediabetes: Secondary | ICD-10-CM | POA: Diagnosis not present

## 2024-01-16 DIAGNOSIS — M8589 Other specified disorders of bone density and structure, multiple sites: Secondary | ICD-10-CM | POA: Diagnosis not present

## 2024-01-16 DIAGNOSIS — I10 Essential (primary) hypertension: Secondary | ICD-10-CM | POA: Diagnosis not present

## 2024-01-16 DIAGNOSIS — E559 Vitamin D deficiency, unspecified: Secondary | ICD-10-CM | POA: Diagnosis not present

## 2024-01-16 DIAGNOSIS — L03116 Cellulitis of left lower limb: Secondary | ICD-10-CM | POA: Diagnosis not present

## 2024-01-16 DIAGNOSIS — S41112A Laceration without foreign body of left upper arm, initial encounter: Secondary | ICD-10-CM | POA: Diagnosis not present

## 2024-01-16 DIAGNOSIS — S81812A Laceration without foreign body, left lower leg, initial encounter: Secondary | ICD-10-CM | POA: Diagnosis not present

## 2024-01-16 DIAGNOSIS — M810 Age-related osteoporosis without current pathological fracture: Secondary | ICD-10-CM | POA: Diagnosis not present

## 2024-01-16 DIAGNOSIS — Z6820 Body mass index (BMI) 20.0-20.9, adult: Secondary | ICD-10-CM | POA: Diagnosis not present

## 2024-01-16 DIAGNOSIS — D539 Nutritional anemia, unspecified: Secondary | ICD-10-CM | POA: Diagnosis not present

## 2024-01-16 DIAGNOSIS — S61512A Laceration without foreign body of left wrist, initial encounter: Secondary | ICD-10-CM | POA: Diagnosis not present

## 2024-01-16 DIAGNOSIS — E785 Hyperlipidemia, unspecified: Secondary | ICD-10-CM | POA: Diagnosis not present

## 2024-01-20 DIAGNOSIS — Z9181 History of falling: Secondary | ICD-10-CM | POA: Diagnosis not present

## 2024-01-20 DIAGNOSIS — Z0000 Encounter for general adult medical examination without abnormal findings: Secondary | ICD-10-CM | POA: Diagnosis not present

## 2024-01-26 DIAGNOSIS — S61512A Laceration without foreign body of left wrist, initial encounter: Secondary | ICD-10-CM | POA: Diagnosis not present

## 2024-01-26 DIAGNOSIS — S81812A Laceration without foreign body, left lower leg, initial encounter: Secondary | ICD-10-CM | POA: Diagnosis not present

## 2024-01-26 DIAGNOSIS — S81012A Laceration without foreign body, left knee, initial encounter: Secondary | ICD-10-CM | POA: Diagnosis not present

## 2024-02-02 DIAGNOSIS — S61512A Laceration without foreign body of left wrist, initial encounter: Secondary | ICD-10-CM | POA: Diagnosis not present

## 2024-02-02 DIAGNOSIS — S81012A Laceration without foreign body, left knee, initial encounter: Secondary | ICD-10-CM | POA: Diagnosis not present

## 2024-02-02 DIAGNOSIS — S81812A Laceration without foreign body, left lower leg, initial encounter: Secondary | ICD-10-CM | POA: Diagnosis not present

## 2024-02-04 DIAGNOSIS — M810 Age-related osteoporosis without current pathological fracture: Secondary | ICD-10-CM | POA: Diagnosis not present

## 2024-02-04 DIAGNOSIS — Z23 Encounter for immunization: Secondary | ICD-10-CM | POA: Diagnosis not present

## 2024-02-09 DIAGNOSIS — S81012A Laceration without foreign body, left knee, initial encounter: Secondary | ICD-10-CM | POA: Diagnosis not present

## 2024-02-09 DIAGNOSIS — S61512A Laceration without foreign body of left wrist, initial encounter: Secondary | ICD-10-CM | POA: Diagnosis not present

## 2024-02-09 DIAGNOSIS — S81812A Laceration without foreign body, left lower leg, initial encounter: Secondary | ICD-10-CM | POA: Diagnosis not present

## 2024-02-10 DIAGNOSIS — Z23 Encounter for immunization: Secondary | ICD-10-CM | POA: Diagnosis not present

## 2024-03-01 DIAGNOSIS — L570 Actinic keratosis: Secondary | ICD-10-CM | POA: Diagnosis not present

## 2024-03-01 DIAGNOSIS — L821 Other seborrheic keratosis: Secondary | ICD-10-CM | POA: Diagnosis not present

## 2024-03-01 DIAGNOSIS — L578 Other skin changes due to chronic exposure to nonionizing radiation: Secondary | ICD-10-CM | POA: Diagnosis not present

## 2024-04-08 ENCOUNTER — Ambulatory Visit (INDEPENDENT_AMBULATORY_CARE_PROVIDER_SITE_OTHER)

## 2024-04-08 ENCOUNTER — Encounter: Payer: Self-pay | Admitting: Cardiology

## 2024-04-08 ENCOUNTER — Ambulatory Visit: Attending: Cardiology | Admitting: Cardiology

## 2024-04-08 VITALS — BP 181/80 | HR 65 | Ht 67.0 in | Wt 140.0 lb

## 2024-04-08 DIAGNOSIS — R002 Palpitations: Secondary | ICD-10-CM | POA: Diagnosis not present

## 2024-04-08 DIAGNOSIS — R030 Elevated blood-pressure reading, without diagnosis of hypertension: Secondary | ICD-10-CM

## 2024-04-08 DIAGNOSIS — R0789 Other chest pain: Secondary | ICD-10-CM | POA: Insufficient documentation

## 2024-04-08 DIAGNOSIS — I10 Essential (primary) hypertension: Secondary | ICD-10-CM | POA: Diagnosis not present

## 2024-04-08 NOTE — Progress Notes (Signed)
 Cardiology Office Note:    Date:  04/08/2024   ID:  Veronica Maynard, DOB 01-07-1941, MRN [id number]  PCP:  Keren Vicenta BRAVO, MD  Cardiologist:  Lamar Fitch, MD    Referring MD: Keren Vicenta BRAVO, MD   Chief Complaint  Patient presents with   Annual Exam  Doing fine cardiac wise  History of Present Illness:    Veronica Maynard is a 83 y.o. female past medical history significant for essential hypertension which is somewhat difficult to control with some whitecoat hypertension, atypical chest pain palpitation comes today to my office for follow-up cardiac wise doing well denies of any palpitation chest pain tightness squeezing pressure burning chest, in the summertime she tripped fell down and ended having injury to her left leg she went to hospital she was given OxyContin  and then she went home and she fell down again and injured her left arm since that time she was going to wound care center thinks that healed already and doing well overall cardiac wise fine.  Issue is blood pressure which is still high when she comes to our office  Past Medical History:  Diagnosis Date   Arthritis    Essential hypertension 07/04/2016   Headache    due to hormone replacement   Hypertension    OA (osteoarthritis) of hip 06/08/2014   Palpitations 07/04/2016   Pure hypertriglyceridemia 07/04/2016    Past Surgical History:  Procedure Laterality Date   SQUAMOUS CELL CARCINOMA EXCISION     TONSILLECTOMY     age 67   TOTAL HIP ARTHROPLASTY Right 06/08/2014   Procedure: RIGHT TOTAL HIP ARTHROPLASTY ANTERIOR APPROACH;  Surgeon: Dempsey Melodi GAILS, MD;  Location: [facility];  Service: Orthopedics;  Laterality: Right;   TUBAL LIGATION      Current Medications: Current Meds  Medication Sig   amLODipine (NORVASC) 5 MG tablet Take 5 mg by mouth daily.   aspirin EC 81 MG tablet Take 81 mg by mouth daily.   denosumab  (PROLIA ) 60 MG/ML SOSY injection Inject 60 mg into the skin every 6 (six) months.    fluticasone  (FLONASE ) 50 MCG/ACT nasal spray Place 2 sprays into both nostrils every evening.   loratadine  (CLARITIN ) 10 MG tablet Take 10 mg by mouth daily.   meclizine (ANTIVERT) 25 MG tablet Take 25 mg by mouth every 6 (six) hours as needed for nausea or dizziness.   metoprolol  succinate (TOPROL -XL) 50 MG 24 hr tablet Take 25-50 mg by mouth See admin instructions. 25mg  in the morning and 50mg  in the evening   pravastatin (PRAVACHOL) 20 MG tablet Take 20 mg by mouth daily.     Allergies:   Rivaroxaban    Social History   Socioeconomic History   Marital status: Married    Spouse name: Not on file   Number of children: Not on file   Years of education: Not on file   Highest education level: Not on file  Occupational History   Not on file  Tobacco Use   Smoking status: Never   Smokeless tobacco: Never  Vaping Use   Vaping status: Never Used  Substance and Sexual Activity   Alcohol  use: Yes    Comment: rarely   Drug use: No   Sexual activity: Not on file  Other Topics Concern   Not on file  Social History Narrative   Not on file   Social Drivers of Health   Financial Resource Strain: Not on file  Food Insecurity: Not on file  Transportation Needs:  Not on file  Physical Activity: Not on file  Stress: Not on file  Social Connections: Not on file     Family History: The patient's family history includes Heart disease in her father and mother. ROS:   Please see the history of present illness.    All 14 point review of systems negative except as described per history of present illness  EKGs/Labs/Other Studies Reviewed:         Recent Labs: No results found for requested labs within last 365 days.  Recent Lipid Panel No results found for: CHOL, TRIG, HDL, CHOLHDL, VLDL, LDLCALC, LDLDIRECT  Physical Exam:    VS:  BP (!) 181/80   Pulse 65   Ht 5' 7 (1.702 m)   Wt 140 lb (63.5 kg)   SpO2 98%   BMI 21.93 kg/m     Wt Readings from Last 3  Encounters:  04/08/24 140 lb (63.5 kg)  03/31/23 135 lb 3.2 oz (61.3 kg)  01/02/22 143 lb (64.9 kg)     GEN:  Well nourished, well developed in no acute distress HEENT: Normal NECK: No JVD; No carotid bruits LYMPHATICS: No lymphadenopathy CARDIAC: RRR, no murmurs, no rubs, no gallops RESPIRATORY:  Clear to auscultation without rales, wheezing or rhonchi  ABDOMEN: Soft, non-tender, non-distended MUSCULOSKELETAL:  No edema; No deformity  SKIN: Warm and dry LOWER EXTREMITIES: no swelling NEUROLOGIC:  Alert and oriented x 3 PSYCHIATRIC:  Normal affect   ASSESSMENT:    1. Essential hypertension   2. Atypical chest pain   3. Palpitations    PLAN:    In order of problems listed above:  Essential hypertension still not well-controlled is some suspicion for whitecoat hypertension, she shown blood pressure measurements from home in the morning it was always good we will put 24 hours blood pressure monitoring her. Atypical chest pain denies having any. Palpitations controlled with beta-blocker which I will continue. Dyslipidemia I did review KPN which show me HDL 85 LDL 99 continue present management   Medication Adjustments/Labs and Tests Ordered: Current medicines are reviewed at length with the patient today.  Concerns regarding medicines are outlined above.  Orders Placed This Encounter  Procedures   EKG 12-Lead   Medication changes: No orders of the defined types were placed in this encounter.   Signed, Lamar DOROTHA Fitch, MD, [facility] 04/08/2024 2:38 PM    [facility]

## 2024-04-08 NOTE — Patient Instructions (Signed)
 Medication Instructions:  Your physician recommends that you continue on your current medications as directed. Please refer to the Current Medication list given to you today.  *If you need a refill on your cardiac medications before your next appointment, please call your pharmacy*  Lab Work: NONE If you have labs (blood work) drawn today and your tests are completely normal, you will receive your results only by: MyChart Message (if you have MyChart) OR A paper copy in the mail If you have any lab test that is abnormal or we need to change your treatment, we will call you to review the results.  Testing/Procedures: 24 HR BP Monitor to be applied in office and returned the next day. This will be a scheduled appt.  Follow-Up: At [facility], you and your health needs are our priority.  As part of our continuing mission to provide you with exceptional heart care, our providers are all part of one team.  This team includes your primary Cardiologist (physician) and Advanced Practice Providers or APPs (Physician Assistants and Nurse Practitioners) who all work together to provide you with the care you need, when you need it.  Your next appointment:   1 year(s)  Provider:   Lamar Fitch, MD    We recommend signing up for the patient portal called MyChart.  Sign up information is provided on this After Visit Summary.  MyChart is used to connect with patients for Virtual Visits (Telemedicine).  Patients are able to view lab/test results, encounter notes, upcoming appointments, etc.  Non-urgent messages can be sent to your provider as well.   To learn more about what you can do with MyChart, go to [email or web address].   Other Instructions

## 2024-04-08 NOTE — Addendum Note (Signed)
 Addended by: ARLOA POTASH R on: 04/08/2024 02:45 PM   Modules accepted: Orders

## 2024-04-26 ENCOUNTER — Ambulatory Visit: Payer: Self-pay | Admitting: Cardiology

## 2024-04-26 DIAGNOSIS — R030 Elevated blood-pressure reading, without diagnosis of hypertension: Secondary | ICD-10-CM | POA: Diagnosis not present

## 2024-04-30 DIAGNOSIS — M8589 Other specified disorders of bone density and structure, multiple sites: Secondary | ICD-10-CM | POA: Diagnosis not present

## 2024-04-30 DIAGNOSIS — Z1231 Encounter for screening mammogram for malignant neoplasm of breast: Secondary | ICD-10-CM | POA: Diagnosis not present

## 2024-05-04 DIAGNOSIS — H524 Presbyopia: Secondary | ICD-10-CM | POA: Diagnosis not present

## 2024-05-04 DIAGNOSIS — H5203 Hypermetropia, bilateral: Secondary | ICD-10-CM | POA: Diagnosis not present

## 2024-05-04 DIAGNOSIS — H35371 Puckering of macula, right eye: Secondary | ICD-10-CM | POA: Diagnosis not present

## 2024-05-04 DIAGNOSIS — H52223 Regular astigmatism, bilateral: Secondary | ICD-10-CM | POA: Diagnosis not present

## 2024-05-04 DIAGNOSIS — H35341 Macular cyst, hole, or pseudohole, right eye: Secondary | ICD-10-CM | POA: Diagnosis not present

## 2024-05-04 DIAGNOSIS — Z9849 Cataract extraction status, unspecified eye: Secondary | ICD-10-CM | POA: Diagnosis not present

## 2024-05-04 DIAGNOSIS — Z961 Presence of intraocular lens: Secondary | ICD-10-CM | POA: Diagnosis not present
